# Patient Record
Sex: Female | Born: 1980 | Race: White | Hispanic: No | Marital: Single | Smoking: Never smoker
Health system: Southern US, Community
[De-identification: ages and names within clinical notes are randomized; demographics above are authoritative.]

---

## 2007-05-20 ENCOUNTER — Encounter: Admission: RE | Admit: 2007-05-20 | Discharge: 2007-05-20 | Payer: Self-pay | Admitting: *Deleted

## 2009-06-14 ENCOUNTER — Ambulatory Visit (HOSPITAL_COMMUNITY): Admission: RE | Admit: 2009-06-14 | Discharge: 2009-06-14 | Payer: Self-pay | Admitting: Obstetrics and Gynecology

## 2010-03-21 ENCOUNTER — Encounter
Admission: RE | Admit: 2010-03-21 | Discharge: 2010-03-21 | Payer: Self-pay | Source: Home / Self Care | Attending: Obstetrics and Gynecology | Admitting: Obstetrics and Gynecology

## 2010-03-21 ENCOUNTER — Inpatient Hospital Stay (HOSPITAL_COMMUNITY)
Admission: EM | Admit: 2010-03-21 | Discharge: 2010-03-24 | Payer: Self-pay | Source: Home / Self Care | Attending: Cardiothoracic Surgery | Admitting: Cardiothoracic Surgery

## 2010-03-31 LAB — POCT I-STAT, CHEM 8
BUN: 10 mg/dL (ref 6–23)
Calcium, Ion: 1.07 mmol/L — ABNORMAL LOW (ref 1.12–1.32)
Chloride: 110 mEq/L (ref 96–112)
Creatinine, Ser: 0.9 mg/dL (ref 0.4–1.2)
Glucose, Bld: 96 mg/dL (ref 70–99)
HCT: 41 % (ref 36.0–46.0)
Hemoglobin: 13.9 g/dL (ref 12.0–15.0)
Potassium: 4 mEq/L (ref 3.5–5.1)
Sodium: 139 mEq/L (ref 135–145)
TCO2: 22 mmol/L (ref 0–100)

## 2010-04-04 NOTE — H&P (Signed)
  [name], [name]              ACCOUNT NO.:  [id number]  MEDICAL RECORD NO.:  [id number]          PATIENT TYPE:  INP  LOCATION:  2037                         FACILITY:  [facility]  PHYSICIAN:  Sheliah Plane, MD    DATE OF BIRTH:  January 18, 1981  DATE OF ADMISSION:  03/21/2010 DATE OF DISCHARGE:                             HISTORY & PHYSICAL   CHIEF COMPLAINT:  Right pleuritic chest pain.  HISTORY OF PRESENT ILLNESS:  The patient is a 30 year old female with no previous history of known spontaneous pneumothorax but with several year history of episodic right chest pain usually associated with her menstrual period.  She had never had a chest x-ray done with these episodes in the past, however, today she had an episode of increasing right chest pain and increasing shortness of breath.  At approximately 1 o'clock today, a chest x-ray was done and showed a 40% right pneumothorax.  She was instructed to come to the emergency room and I was called at about 9:15 that she was here with this pneumothorax but in no acute distress but she does note shortness of breath, especially when lying down.  She is a nonsmoker, has no history of asthma.  No family history of pneumothoraces.  She noted similar symptoms in the past, though not nearly as severe.  She is a nonsmoker, nondrinker.  REVIEW OF SYSTEMS:  Negative with exception that she started her menstrual period today.  CURRENT MEDICATIONS:  None.  PHYSICAL EXAMINATION:  VITAL SIGNS:  Blood pressure is 113/75, temp is 98.9, pulse 96, respiratory rate 16, O2 sats 100% on room air. GENERAL:  She is awake, alert, neurologically intact. EYES:  She has no icterus, no carotid bruits. LUNGS:  Have marked decreased breath sounds over the right, clear over the left.  There is no active wheezing. ABDOMEN:  Benign without palpable masses or tenderness.  She has no calf tenderness.  Chest x-ray is reviewed and appears to have a 40% right  pneumothorax without shift.  With the patient's symptoms and the 40% pneumo, discussed with her placement of a right chest tube.  She is agreeable with this and signed informed consent and confirmation of her identity inside is completed with the nurses present.  PROCEDURE NOTE:  With IV Versed and IV morphine, sedation with cardiac monitoring and O2 sat monitoring in place.  The patient's right lateral chest prepped with Betadine and infiltrated with ChloraPrep and 1% lidocaine was infiltrated locally.  A small incision was made, forceps was introduced slightly spreading the muscle fibers, and a 28 chest tube was introduced into the right chest with return of air.  Chest tubes attached to a Pleur-Evac tube is secured in place.  Chest x-ray is pending.     Sheliah Plane, MD     EG/MEDQ  D:  03/21/2010  T:  03/22/2010  Job:  [id number]  Electronically Signed by Sheliah Plane MD on 04/04/2010 01:52:10 PM

## 2010-04-07 ENCOUNTER — Encounter
Admission: RE | Admit: 2010-04-07 | Discharge: 2010-04-07 | Payer: Self-pay | Source: Home / Self Care | Attending: Cardiothoracic Surgery | Admitting: Cardiothoracic Surgery

## 2010-04-07 ENCOUNTER — Ambulatory Visit
Admission: RE | Admit: 2010-04-07 | Discharge: 2010-04-07 | Payer: Self-pay | Source: Home / Self Care | Attending: Cardiothoracic Surgery | Admitting: Cardiothoracic Surgery

## 2010-04-08 NOTE — Assessment & Plan Note (Unsigned)
OFFICE VISIT  [name], [name] DOB:  November 26, 1980                                        April 07, 2010 CHART #:  [id number]  REASON FOR OFFICE VISIT:  Routine followup status post right spontaneous pneumothorax.  HISTORY OF PRESENT ILLNESS:  This is a 30 year old Caucasian female with no previous history of a spontaneous pneumothorax who had experienced a right pleuritic chest pain that was associated with her menstrual cycle. She then developed increasing right chest pain and shortness of breath. She presented to [facility] on March 21, 2010.  A chest x-ray that was done showed a 40% right pneumothorax.  Dr. Tyrone Sage then placed a right chest tube with reexpansion of the right lung.  There was no air leak from the chest tube.  It was able to be removed.  A followup chest x-ray revealed no pneumothorax and she was discharged on March 24, 2010.  She has no complaints today with the exception of some tenderness under her right breast.  She denies any shortness of breath or chest pain.  PHYSICAL EXAMINATION:  General:  This is a pleasant 30 year old Caucasian female, who is in no acute distress, who is alert, oriented, and cooperative.  Vital Signs:  Latest vital signs are as follows.  BP 107/68, heart rate 92, respiration rate 20, and O2 sat 100% on room air. Cardiovascular:  Regular rate and rhythm.  Pulmonary:  Clear to auscultation bilaterally.  No rales, wheezes, or rhonchi.  Chest tube site is clean, dry, and well healed.  DIAGNOSTIC TESTS:  Chest x-ray done today shows no pneumothorax.  IMPRESSION AND PLAN:  The patient is surgically stable status post chest tube insertion for right spontaneous pneumothorax.  She is instructed to follow up with our office p.r.n.  The patient states she had already spoke with Dr. Tyrone Sage upon her admission and is of the understanding that this may happen again with her menstrual cycle.  Regarding the patient's  pain under her right breast, this is likely secondary to previous chest tube insertion site.  The patient states that it is slowly getting better and hopefully the next couple of weeks, this will resolve.  The patient is going to be seen on a p.r.n. basis.  Doree Fudge, PA  DZ/MEDQ  D:  04/07/2010  T:  04/08/2010  Job:  [id number]

## 2010-04-22 NOTE — Discharge Summary (Signed)
[name], [name]              ACCOUNT NO.:  [id number]  MEDICAL RECORD NO.:  [id number]          PATIENT TYPE:  INP  LOCATION:  2037                         FACILITY:  [facility]  PHYSICIAN:  Sheliah Plane, MD    DATE OF BIRTH:  05/15/1980  DATE OF ADMISSION:  03/21/2010 DATE OF DISCHARGE:  03/24/2010                              DISCHARGE SUMMARY   PRIMARY ADMITTING DIAGNOSIS:  Chest pain.  ADDITIONAL/DISCHARGE DIAGNOSES: 1. Spontaneous right pneumothorax. 2. Questionable left lower lobe nodule measuring 3 mm.  PROCEDURES PERFORMED:  Placement of a right chest tube.  HISTORY:  The patient is a 30 year old female who has had several-year history of episodic right-sided chest discomfort, which is usually associated with her menstrual period.  She has never had a chest x-ray done when these episodes have occurred in the past; however, on the date of admission, the episode of chest discomfort was worse than was typical for her and was associated with some shortness of breath.  She ultimately had a chest x-ray performed at the request of her OB/GYN's office and this showed a 40% right pneumothorax.  She was subsequently instructed to come to the Emergency Department at [facility] for further evaluation.  The chest x-ray was reviewed there and there was no evidence of shift, but a 40% pneumothorax was present.  Thoracic Surgery consult was obtained and the patient was seen by Dr. Sheliah Plane.  She remains symptomatic with increased shortness of breath and he felt that at this point, a chest tube should be placed and the patient should be admitted for further evaluation and monitoring.  HOSPITAL COURSE:  Dr. Tyrone Sage did place a right-sided chest tube at the bedside while in the emergency department.  The patient was then transferred to unit 2000 for further monitoring.  Her chest tube was placed on suction and the pneumothorax resolved by chest x-ray.  The tube was  subsequently weaned to water seal and ultimately was discontinued on March 23, 2010.  Followup chest x-ray on March 24, 2010 shows no residual pneumothorax.  During her hospitalization, the patient also had a CT of the chest to evaluate for any bullous disease.  This CT showed no evidence of blebs; however, there was a small nodule in the left lung base measuring 3 mm.  Dr. Tyrone Sage reviewed the films and spoke with the patient about this finding, and we will plan to follow this up as an outpatient.  She remained stable during her hospital stay.  She was seen and evaluated on the morning of March 24, 2010, and was felt to be ready for discharge home at that time.  DISCHARGE MEDICATIONS: 1. Vicodin 5/500 one q.4-6 h. p.r.n. for pain. 2. Advil 200 mg daily for headaches.  DISCHARGE INSTRUCTIONS:  She is asked to refrain from driving, heavy lifting, or strenuous activity.  She may continue ambulating daily and using her incentive spirometer.  She may shower daily and clean her incisions with soap and water.  She will continue her same preoperative diet.  DISCHARGE FOLLOWUP:  She will see Dr. Tyrone Sage in the office on April 10, 2010,  with  a chest x-ray from [facility].  If she experiences any problems or have questions in the interim, she is asked to contact our office immediately.     [name], P.A.   ______________________________ Sheliah Plane, MD    GC/MEDQ  D:  03/24/2010  T:  03/25/2010  Job:  [id number]  cc:   T[facility] Office  Electronically Signed by [name] P.A. on 04/07/2010 03:49:54 PM Electronically Signed by Sheliah Plane MD on 04/22/2010 03:10:48 PM

## 2010-05-15 ENCOUNTER — Other Ambulatory Visit: Payer: Self-pay | Admitting: Cardiothoracic Surgery

## 2010-05-15 ENCOUNTER — Ambulatory Visit (INDEPENDENT_AMBULATORY_CARE_PROVIDER_SITE_OTHER): Payer: BC Managed Care – PPO | Admitting: Cardiothoracic Surgery

## 2010-05-15 ENCOUNTER — Ambulatory Visit
Admission: RE | Admit: 2010-05-15 | Discharge: 2010-05-15 | Disposition: A | Discharge status: Home / Self Care | Payer: BC Managed Care – PPO | Source: Ambulatory Visit | Attending: Cardiothoracic Surgery | Admitting: Cardiothoracic Surgery

## 2010-05-15 DIAGNOSIS — J930 Spontaneous tension pneumothorax: Secondary | ICD-10-CM

## 2010-05-16 NOTE — Assessment & Plan Note (Signed)
OFFICE VISIT  Linda, [name] DOB:  September 02, 1980                                        May 15, 2010 CHART #:  [id number]  The patient was originally seen in early first week in January of this year when she developed a spontaneous right pneumothorax.  A chest tube was placed with resolution.  She has had no obvious recurrence.  At that time, a CT scan did show a 3-mm left lung nodule.  The recommendation at that time was no further CT followup if the patient was low risk for lung cancer, which she is a nonsmoker.  She has had some paresthesias over the right chest probably associated with the chest tube site.  She has had no hemoptysis.  Notes she has mild shortness of breath when she walks quickly through our office.  The chief reason to come to the office today was that she wanted to fly to [place] to see her grandfather who has terminal illness and was concerned about recurrence of pneumothorax in airplane.  We did obtain a chest x-ray today that shows no evidence of pneumothorax.  On exam today, her blood pressure 108/70, pulse is 74, respiratory rate ____18______, O2 sats 99 on room air.  Lungs are clear bilaterally.  The chest tube site is healed.  Overall, she appears stable to fly.  She is aware of the symptoms of recurrent pneumothorax; and because of the issue of the small nodule in the left lung that is of very low probability if anything significant, I have discussed this with her and recommended that we not get a followup CT scan because of the radiation dose in low yield and low risk for lung malignancy, but I will see her back in 6 months with a followup chest x-ray.  Sheliah Plane, MD Electronically Signed  EG/MEDQ  D:  05/15/2010  T:  05/15/2010  Job:  [id number]

## 2010-07-18 ENCOUNTER — Other Ambulatory Visit: Payer: Self-pay | Admitting: Thoracic Surgery

## 2010-07-21 ENCOUNTER — Ambulatory Visit
Admission: RE | Admit: 2010-07-21 | Discharge: 2010-07-21 | Disposition: A | Discharge status: Home / Self Care | Payer: BC Managed Care – PPO | Source: Ambulatory Visit | Attending: Thoracic Surgery | Admitting: Thoracic Surgery

## 2010-07-21 ENCOUNTER — Encounter (INDEPENDENT_AMBULATORY_CARE_PROVIDER_SITE_OTHER): Payer: BC Managed Care – PPO

## 2010-07-21 ENCOUNTER — Inpatient Hospital Stay (HOSPITAL_COMMUNITY)
Admission: AD | Admit: 2010-07-21 | Discharge: 2010-07-25 | DRG: 075 | Disposition: A | Discharge status: Home / Self Care | Payer: BC Managed Care – PPO | Source: Ambulatory Visit | Attending: Cardiothoracic Surgery | Admitting: Cardiothoracic Surgery

## 2010-07-21 DIAGNOSIS — N808 Other endometriosis: Secondary | ICD-10-CM | POA: Diagnosis present

## 2010-07-21 DIAGNOSIS — J9383 Other pneumothorax: Principal | ICD-10-CM | POA: Diagnosis present

## 2010-07-21 DIAGNOSIS — J930 Spontaneous tension pneumothorax: Secondary | ICD-10-CM

## 2010-07-21 DIAGNOSIS — Z79899 Other long term (current) drug therapy: Secondary | ICD-10-CM

## 2010-07-21 LAB — PROTIME-INR
INR: 1 (ref 0.00–1.49)
Prothrombin Time: 13.4 seconds (ref 11.6–15.2)

## 2010-07-21 LAB — URINE MICROSCOPIC-ADD ON

## 2010-07-21 LAB — COMPREHENSIVE METABOLIC PANEL
ALT: 14 U/L (ref 0–35)
AST: 17 U/L (ref 0–37)
Albumin: 3.8 g/dL (ref 3.5–5.2)
Alkaline Phosphatase: 35 U/L — ABNORMAL LOW (ref 39–117)
BUN: 10 mg/dL (ref 6–23)
CO2: 24 mEq/L (ref 19–32)
Calcium: 9.4 mg/dL (ref 8.4–10.5)
Chloride: 103 mEq/L (ref 96–112)
Creatinine, Ser: 0.85 mg/dL (ref 0.4–1.2)
GFR calc Af Amer: >60 mL/min (ref >60)
GFR calc non Af Amer: >60 mL/min (ref >60)
Glucose, Bld: 150 mg/dL — ABNORMAL HIGH (ref 70–99)
Potassium: 3.5 mEq/L (ref 3.5–5.1)
Sodium: 138 mEq/L (ref 135–145)
Total Bilirubin: 0.2 mg/dL — ABNORMAL LOW (ref 0.3–1.2)
Total Protein: 7.2 g/dL (ref 6.0–8.3)

## 2010-07-21 LAB — URINALYSIS, ROUTINE W REFLEX MICROSCOPIC
Glucose, UA: NEGATIVE mg/dL
Ketones, ur: 15 mg/dL — AB
Leukocytes, UA: NEGATIVE
Nitrite: NEGATIVE
Protein, ur: NEGATIVE mg/dL
Specific Gravity, Urine: 1.025 (ref 1.005–1.030)
Urobilinogen, UA: 1 mg/dL (ref 0.0–1.0)
pH: 6 (ref 5.0–8.0)

## 2010-07-21 LAB — CBC
HCT: 36.2 % (ref 36.0–46.0)
Hemoglobin: 11.4 g/dL — ABNORMAL LOW (ref 12.0–15.0)
MCH: 27 pg (ref 26.0–34.0)
MCHC: 31.5 g/dL (ref 30.0–36.0)
MCV: 85.8 fL (ref 78.0–100.0)
Platelets: 185 10*3/uL (ref 150–400)
RBC: 4.22 MIL/uL (ref 3.87–5.11)
RDW: 15 % (ref 11.5–15.5)
WBC: 6.8 10*3/uL (ref 4.0–10.5)

## 2010-07-21 LAB — TYPE AND SCREEN
ABO/RH(D): A POS
Antibody Screen: NEGATIVE
Unit division: 0

## 2010-07-21 LAB — APTT: aPTT: 31 seconds (ref 24–37)

## 2010-07-21 LAB — ABO/RH: ABO/RH(D): A POS

## 2010-07-21 LAB — MRSA PCR SCREENING: MRSA by PCR: NEGATIVE

## 2010-07-22 ENCOUNTER — Inpatient Hospital Stay (HOSPITAL_COMMUNITY): Payer: BC Managed Care – PPO

## 2010-07-22 ENCOUNTER — Other Ambulatory Visit: Payer: Self-pay | Admitting: Cardiothoracic Surgery

## 2010-07-22 DIAGNOSIS — J930 Spontaneous tension pneumothorax: Secondary | ICD-10-CM

## 2010-07-22 DIAGNOSIS — D213 Benign neoplasm of connective and other soft tissue of thorax: Secondary | ICD-10-CM

## 2010-07-22 LAB — GLUCOSE, CAPILLARY: Glucose-Capillary: 89 mg/dL (ref 70–99)

## 2010-07-23 ENCOUNTER — Inpatient Hospital Stay (HOSPITAL_COMMUNITY): Payer: BC Managed Care – PPO

## 2010-07-23 LAB — CBC
HCT: 37.8 % (ref 36.0–46.0)
Hemoglobin: 12.2 g/dL (ref 12.0–15.0)
MCH: 27.9 pg (ref 26.0–34.0)
MCHC: 32.3 g/dL (ref 30.0–36.0)
MCV: 86.3 fL (ref 78.0–100.0)
Platelets: 186 10*3/uL (ref 150–400)
RBC: 4.38 MIL/uL (ref 3.87–5.11)
RDW: 14.8 % (ref 11.5–15.5)
WBC: 10.9 10*3/uL — ABNORMAL HIGH (ref 4.0–10.5)

## 2010-07-23 LAB — BASIC METABOLIC PANEL
BUN: 4 mg/dL — ABNORMAL LOW (ref 6–23)
CO2: 24 mEq/L (ref 19–32)
Calcium: 8.6 mg/dL (ref 8.4–10.5)
Chloride: 105 mEq/L (ref 96–112)
Creatinine, Ser: 0.67 mg/dL (ref 0.4–1.2)
GFR calc Af Amer: >60 mL/min (ref >60)
GFR calc non Af Amer: >60 mL/min (ref >60)
Glucose, Bld: 141 mg/dL — ABNORMAL HIGH (ref 70–99)
Potassium: 3.5 mEq/L (ref 3.5–5.1)
Sodium: 138 mEq/L (ref 135–145)

## 2010-07-23 LAB — POCT I-STAT 3, ART BLOOD GAS (G3+)
Acid-base deficit: 1 mmol/L (ref 0.0–2.0)
Bicarbonate: 23.9 mEq/L (ref 20.0–24.0)
O2 Saturation: 94 %
TCO2: 25 mmol/L (ref 0–100)
pCO2 arterial: 39 mmHg (ref 35.0–45.0)
pH, Arterial: 7.395 (ref 7.350–7.400)
pO2, Arterial: 71 mmHg — ABNORMAL LOW (ref 80.0–100.0)

## 2010-07-24 ENCOUNTER — Inpatient Hospital Stay (HOSPITAL_COMMUNITY): Payer: BC Managed Care – PPO

## 2010-07-25 ENCOUNTER — Inpatient Hospital Stay (HOSPITAL_COMMUNITY): Payer: BC Managed Care – PPO

## 2010-07-25 NOTE — H&P (Signed)
[name], [name]              ACCOUNT NO.:  [id number]  MEDICAL RECORD NO.:  [id number]           PATIENT TYPE:  I  LOCATION:  2007                         FACILITY:  [facility]  PHYSICIAN:  Sheliah Plane, MD    DATE OF BIRTH:  [birth year]  DATE OF ADMISSION:  07/21/2010 DATE OF DISCHARGE:                             HISTORY & PHYSICAL   CHIEF COMPLAINT:  Shortness of breath.  HISTORY OF PRESENT ILLNESS:  The patient is a 30 year old female who was admitted to [facility] in January 2012 with a spontaneous right pneumothorax.  She apparently had begun experiencing right-sided chest discomfort around the time of her menstrual cycle and ultimately presented with increasing shortness of breath and 40% right pneumothorax.  A chest tube was placed and she was admitted for further management.  At the time of her discharge, her pneumothorax had completely resolved.  CT scan was performed during that admission which showed no evidence of bullous disease; however, there was a small nodule incidentally noted in the left lung base.  She returned for follow up in our office as recently as May 15, 2010, and at that time since she had remained asymptomatic and had no further issues, Dr. Tyrone Sage recommended a 34-month followup with a chest x-ray to reevaluate the left lung nodule.  Over the course of the last 4 days, the patient has noted increasing dyspnea with recurrence of right-sided chest discomfort, with pain in her back and her right shoulder which was similar in nature to the pain she experienced with her initial pneumothorax.  She denies fevers, chills, cough, hemoptysis, or upper respiratory symptoms.  She called our office today and was seen with a chest x-ray which showed a recurrent 40% right pneumothorax.  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  The patient had just experienced a dosage change, and at the time she developed symptoms on Thursday, was at the point where her normal menstrual cycle would have started.  PAST MEDICAL HISTORY:  Negative for cardiopulmonary disease, diabetes, cancer, strokes, or other chronic medical problems.  CURRENT MEDICATIONS: 1. Birth control pills. 2. Claritin 10 mg daily.  ALLERGIES:  No known drug allergies.  PAST SURGICAL HISTORY:  None.  FAMILY HISTORY:  Noncontributory to present illness.  SOCIAL HISTORY:  She is single and resides in [place], [place]. 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  She does not smoke.  REVIEW OF SYSTEMS:  See history of present illness for pertinent positives and negatives.  She denies any weight loss, visual changes, syncope, dizziness, heart palpitations, abdominal pain, nausea, vomiting, diarrhea, constipation, hematochezia, melena, hematemesis, hematuria, dysuria or nocturia, musculoskeletal pain or swelling, intolerance to heat or cold.  PHYSICAL EXAMINATION:  VITAL SIGNS:  Blood pressure is 135/85, pulse is 110 and regular, respirations 20, O2 sat 99% on room air. HEENT:  Normocephalic, atraumatic.  Pupils equal, round, and reactive to light and accommodation.  Extraocular movements intact.  Exam of the external ears and nose reveal no abnormalities.  Oropharynx is clear with moist mucous membranes. NECK:  Supple.  No adenopathy. HEART:  Regular rate and rhythm without murmurs, rubs, or gallops. LUNGS:  Breath sounds are slightly diminished in the right base, otherwise clear. ABDOMEN:  Soft, nontender, nondistended with active bowel sounds. EXTREMITIES:  No clubbing, cyanosis, or edema.  Chest x-ray shows a 40% right pneumothorax.  ASSESSMENT AND PLAN:  [name] has had a recurrence of right spontaneous pneumothorax.  At this time since she is not in respiratory distress, she would like to avoid a chest tube  placement.  However, Dr. Tyrone Sage feels that she should be admitted for observation and for definitive management of her recurrent pneumothorax.  We have discussed all the risks, benefits, and alternatives of proceeding with a right VATS with bleb resection and pleurodesis and the patient agrees to proceed.  She will be scheduled for surgery on Jul 22, 2010, by Dr. Tyrone Sage.     Coral Ceo, P.A.   ______________________________ Sheliah Plane, MD    GC/MEDQ  D:  07/21/2010  T:  07/22/2010  Job:  [id number]  Electronically Signed by Coral Ceo P.A. on 07/22/2010 01:07:19 PM Electronically Signed by Sheliah Plane MD on 07/25/2010 09:42:53 AM

## 2010-07-25 NOTE — Op Note (Signed)
NAMELORMA, HEATER              ACCOUNT NO.:  [id number]  MEDICAL RECORD NO.:  [id number]           PATIENT TYPE:  I  LOCATION:  2313                         FACILITY:  [facility]  PHYSICIAN:  Sheliah Plane, MD    DATE OF BIRTH:  24-Jun-1980  DATE OF PROCEDURE:  07/22/2010 DATE OF DISCHARGE:                              OPERATIVE REPORT   PREOPERATIVE DIAGNOSIS:  Recurrent right pneumothorax, question catamenial pneumothorax.  POSTOPERATIVE DIAGNOSIS:  Recurrent right pneumothorax, question catamenial pneumothorax.  SURGICAL PROCEDURE:  Right video-assisted thoracoscopy with mini thoracotomy, wedge resection of lower edge of right upper lobe, stapling of apex of the lung, biopsy of diaphragm, and plication of diaphragm.  SURGEON:  Sheliah Plane, MD.  FIRST ASSISTANT:  Rowe Clack, PA-C.  BRIEF HISTORY:  The patient is a 30 year old female who in January of this year presented with a right pneumothorax.  At that time, a right chest tube was placed with resolution of the pneumothorax.  She had given a history of several previous episodes of right chest pain associated with menses.  The diagnosis of catamenial pneumothorax was considered with the resolution of pneumothorax with chest tube and no further treatment was carried out, although, she did have manipulation of hormone treatments.  She returned with recurrent right chest pain, and on chest x-ray was showed to have 40%-50% pneumothorax. With the second recurrence, right VATS was recommended to her.  She agreed and signed informed consent.  DESCRIPTION OF PROCEDURE:  With Swan-Ganz and arterial line monitors in place and after completion of formal time-out, the patient was turned in lateral decubitus position.  The right side of the chest was prepped with Betadine and draped in usual sterile manner.  In the fourth intercostal space, midaxillary line, a trocar site was created after deflating the right lung.  Through  this, a 30-degree video thoracoscope was passed and the chest cavity was examined.  There were a few fibrinous adhesions at the apex.  There were no large apical blebs appreciated.  There was one area along the lower edge of the right upper lobe fissure that was suspicious, in addition, on the membranous diaphragm along the dome where several areas of darkened areas with appeared to be hemosiderin deposits, raising issue of questionable relationship to endometriosis.  The few adhesions at the apex, additional anterior and posterior port slightly higher were then placed. The adhesions to the top lung were taken down.  The apex of the lung was examined, although, there was no large blebs appreciated.  We stapled across the apex of the lung with 45 no-knife stapler.  The small suspicious area along the edge of the right upper lobe with a small wedge resection was performed and this segment of lung was submitted to the pathologist and labeled, right upper lobe.  Attention was then turned to close examination of the diaphragm.  Through the scope, themost prominent of these areas were biopsied with a biopsy forceps. Frozen section was indeterminate, appeared to be proliferative tissue. The pathologist raise Dr. Luisa Hart the question of malignancy.  However, grossly this did not appear to be the case.  With  biopsy, the areas of discoloration were extremely thin and even a small biopsy forces resulted in a defect in the diaphragm.  It was decided that these were most likely channels of and potential source of pneumothorax, low on the chest and the midaxillary line, approximately the 8 or 9 space as directed with the scope.  A small incision was made between the ribs with the scope and through the small incision we could precisely deal with the diaphragm.  Additional biopsy of the suspicious areas on the diaphragm were biopsied, and again frozen section was reported back by Dr. Luisa Hart is consistent  with endometriosis.  The areas of discoloration and thinness along the diaphragm were somewhat in linear fashion along the dome.  It was decided to reinforce these areas by plicating the thinned areas of diaphragm.  Interrupted pledgeted 4-0 Prolene sutures were placed in linear fashion, plicating along approximately 4 cm area along the dome of the diaphragm.  The remainder of the diaphragm appeared completely normal without any areas of tenderness or discoloration.  The muscular portion of the diaphragm appeared totally normal.  With this completed, the lung was re-expanded and the saline was placed into the chest cavity.  There was no evidence of air leak.  The small lower incision was closed after drilling two small holes in each rib associated with the incision and through this intercostal suture was placed.  The deep layers were closed with interrupted 0 Vicryl and 3-0 subcuticular stitch.  The anterior and posterior port sites were closed with interrupted 2-0 Vicryl and 3-0 subcuticular stitch and mid axillary line port site was used as the chest tube site and a single 28 chest tube was placed toward the apex of the lung, and secured in place.  The right lung was ventilated.  There was no air leak from the chest tube.  The patient was awakened and extubated in operating room and transferred to the recovery room in good condition.  Sponge and needle count was reported as correct at the completion of procedure.  ESTIMATED BLOOD LOSS:  Approximately 100 mL.     Sheliah Plane, MD     EG/MEDQ  D:  07/23/2010  T:  07/23/2010  Job:  [id number]  Electronically Signed by Sheliah Plane MD on 07/25/2010 09:42:55 AM

## 2010-07-28 NOTE — Discharge Summary (Signed)
[name], [name]              ACCOUNT NO.:  [id number]  MEDICAL RECORD NO.:  [id number]           PATIENT TYPE:  I  LOCATION:  2032                         FACILITY:  [facility]  PHYSICIAN:  Sheliah Plane, MD    DATE OF BIRTH:  [birth year]  DATE OF ADMISSION:  07/21/2010 DATE OF DISCHARGE:  07/25/2010                              DISCHARGE SUMMARY   ADMITTING DIAGNOSES: 1. Recurrent right spontaneous pneumothorax. 2. Likely endometriosis.  DISCHARGE DIAGNOSES: 1. Recurrent right spontaneous pneumothorax. 2. Likely endometriosis.  PROCEDURES:  Right video-assisted thoracoscopic surgery, right mini thoracotomy, wedge resection of right upper lobe, stapling of the apex of the lung, biopsies of the diaphragm, and plication of the diaphragm by Dr. Tyrone Sage on Jul 22, 2010.  PATHOLOGY RESULTS:  Soft tissue biopsy of the diaphragm implant: 1. Negative for malignancy. 2. Again negative for malignancy, benign endometriotic implants.  Lung wedge biopsy of the right upper lobe:  Negative for dysplasia or malignancy, patchy minimal chronic inflammation.  HISTORY OF PRESENT ILLNESS:  This is a 30 year old Caucasian female who experienced her first spontaneous right pneumothorax around the time of her menstrual cycle, March 21, 2010.  Her symptomatology included right- sided chest discomfort and increasing shortness of breath.  Chest x-ray that was done at that time showed a 40% right pneumothorax.  A CT scan of the chest that was done on March 23, 2010 showed right lung pneumothorax, no blood seen, right base atelectasis, and air space consolidation and a small node in the left lung that measured about 3 mm.  A right chest tube was placed, the patient had re-expansion of her lung, and was discharged in stable condition on March 25, 2010.  The patient was then seen on two different subsequent office visits where she was not found to have a pneumothorax.  However, over the course  of approximately 4 days prior to this admission, the patient began experiencing increasing dyspnea with recurrence of right-sided chest discomfort with pain in her back and right shoulder (similar to her previous experience when she had a pneumothorax).  She denied any fever, chills, cough, hemoptysis, or upper respiratory symptoms.  A chest x-ray that was done showed a recurrent right 40% pneumothorax.  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  The patient was admitted to [facility] for surgical treatment of her recurrent right pneumothorax.  BRIEF HOSPITAL COURSE STAY:  The patient underwent the aforementioned right lung surgery on Jul 22, 2010.  As previously stated, one of the biopsies of the implant on the diaphragm was an endometriotic implant. The patient remained afebrile and hemodynamically stable postoperatively.  She did not have an air leak.  Her chest tube was placed to water seal.  She was felt surgically stable for transfer from the Intensive Care Unit to 2000 on Jul 23, 2010.  Her Foley was removed on this day.  Followup chest x-rays revealed a small stable right pneumothorax.  Again, she had no air leak and her chest tube is going to be removed  today on postoperative day #2.  She will have a chest x-ray obtained in the morning and provided this remained stable she will be surgically stable for discharge on Jul 25, 2010.  LATEST LABORATORY STUDIES:  BMET done on Jul 23, 2010, potassium 3.5 has been supplemented, sodium 138, BUN and creatinine 4 and 0.67 respectively.  CBC on this date, H and H 12.2 and 37.8, white count 10,900, and platelet count of 86,000.  Latest chest x-ray done on Jul 24, 2010 showed a stable small right pneumothorax and increased basilar atelectasis.  DISCHARGE INSTRUCTIONS:  Include the following:  DIET:  Regular  diet.  ACTIVITY:  The patient may walk up steps.  She may shower.  She is not to lift more than 10 pounds for 2 weeks.  She is not to drive until after 2 weeks.  She is to continue with the breathing exercise daily. She is to walk daily and increase her frequency and duration as she tolerates.  WOUND CARE:  She is to use soap and water on her wounds.  She is to contact our office if any wound problems arise.  FOLLOWUP APPOINTMENTS:  Include to see Dr. Tyrone Sage on Aug 07, 2010 at 11:00 a.m. and 45 minutes prior to this office appointment a chest x-ray will be obtained.  DISCHARGE MEDICATIONS AT TIME OF DICTATION: 1. Percocet 5/325 one to two tablets p.o. q.4-6 h. p.r.n. pain. 2. Claritin 10 mg p.o. daily. 3. Lo Loestrin birth control pill 1 tablet p.o. daily. 4. Phentermine 15 mg p.o. daily p.r.n.     Doree Fudge, PA   ______________________________ Sheliah Plane, MD    DZ/MEDQ  D:  07/24/2010  T:  07/24/2010  Job:  [id number]  cc:   Arlana Lindau, NP  Electronically Signed by Doree Fudge PA on 07/28/2010 08:12:49 AM Electronically Signed by Sheliah Plane MD on 07/28/2010 04:10:33 PM

## 2010-07-30 ENCOUNTER — Encounter (INDEPENDENT_AMBULATORY_CARE_PROVIDER_SITE_OTHER): Payer: Self-pay

## 2010-07-30 DIAGNOSIS — J930 Spontaneous tension pneumothorax: Secondary | ICD-10-CM

## 2010-08-06 ENCOUNTER — Other Ambulatory Visit: Payer: Self-pay | Admitting: Cardiothoracic Surgery

## 2010-08-06 DIAGNOSIS — J930 Spontaneous tension pneumothorax: Secondary | ICD-10-CM

## 2010-08-07 ENCOUNTER — Ambulatory Visit
Admission: RE | Admit: 2010-08-07 | Discharge: 2010-08-07 | Disposition: A | Discharge status: Home / Self Care | Payer: BC Managed Care – PPO | Source: Ambulatory Visit | Attending: Cardiothoracic Surgery | Admitting: Cardiothoracic Surgery

## 2010-08-07 ENCOUNTER — Ambulatory Visit (INDEPENDENT_AMBULATORY_CARE_PROVIDER_SITE_OTHER): Payer: Self-pay | Admitting: Cardiothoracic Surgery

## 2010-08-07 DIAGNOSIS — J930 Spontaneous tension pneumothorax: Secondary | ICD-10-CM

## 2010-08-08 NOTE — Assessment & Plan Note (Signed)
OFFICE VISIT  AMINATA, BUFFALO DOB:  [birth year]                                        Aug 07, 2010 CHART #:  [id number]  The patient returns to the office today for followup visit.  She had originally presented in January 2012, with a right pleuritic chest pain, right pneumothorax.  Chest tube was placed with resolution in the pneumothorax.  Consideration for this possibly being related to endometriosis because of her previous history of intermittent right chest pain related to menstrual periods over months.  She then returned in April with recurrence of a pneumothorax and VATS of the right chest was recommended.  She did find evidence of endometrial deposits on her diaphragm, this was plicated and some small apical blebs were stapled. She now returns to the office today for followup visit, doing well.  She has not returned to work.  She has some mild right chest discomfort at the incision sites, but this is markedly improved and she is no longer needing any pain medicine.  On examination, her blood pressure 103/62, pulse 84, respiratory rate is 99.  Lungs are clear bilaterally.  Chest tube sites are all healing well.  She has no calf tenderness.  Follow up chest x-ray shows postoperative changes.  There is no evidence of effusion.  Lung fields are clear.  The patient is doing well following surgical treatment of her recurrent spontaneous pneumo with biopsy-proven endometrial deposits on her diaphragm.  We will plan to see her back in 3 months with a followup chest x-ray.  Sheliah Plane, MD Electronically Signed  EG/MEDQ  D:  08/07/2010  T:  08/08/2010  Job:  [id number]  cc:   [facility] J. Sherrie Mustache

## 2010-09-23 NOTE — Assessment & Plan Note (Signed)
OFFICE VISIT  CADYN, RODGER DOB:  1980-09-19                                        Jul 21, 2010 CHART #:  [id number]  Ms. Flowers is well known to the [facility] office.  She is status post previous admission in January 2012 for a spontaneous right pneumothorax. At that time, she underwent placement of a chest tube and her pneumothorax resolved.  She has been seen in our office most as recently as March 1 and at that time she was stable.  She was scheduled to come back in 6 months for followup of a left lung nodule which was found incidentally on CT scan.  However, over the course of the past 4 days, she has developed increasing right-sided chest discomfort with shortness of breath, back pain, and shoulder pain which is similar to the discomfort she experienced when she had her original pneumothorax.  She has been afebrile and has had no other symptoms including cough, wheezing, or upper respiratory symptoms.  Prior to her initial admission, the patient had episodic right chest pain, which was associated with her menstrual period, and after her discharge, she discussed this with her OB/GYN and was placed on an ongoing dose of birth control pills in speculation that this was a catamenial endometriosis related.  The patient reports that her dosage of the birth control pills had recently been adjusted around the time that her symptoms began on Thursday and that would have been the time that her normal menstrual cycle would have occurred.  PHYSICAL EXAM:  VITAL SIGNS:  Blood pressure is 135/85, pulse is 110, respirations 20, O2 sat 99% on room air.  HEART:  Regular rate and rhythm without murmurs, rubs or gallops.  LUNGS:  Clear though breath sounds are slightly diminished on the right.  Chest x-ray shows a 40% right pneumothorax.  ASSESSMENT AND PLAN:  Ms. Collier Salina has recurrent right spontaneous pneumothorax.  I have spoken with Dr. Tyrone Sage and he feels that  since this has recurred in such a short period of time that she will require VATS for definitive therapy.  The patient would like to avoid another chest tube placement at this time and I feel that as long as she is not in respiratory distress this may be observed.  We will admit her to [facility] at this time and we will plan a right VATS with bleb resection and pleurodesis on Jul 22, 2010.  Coral Ceo, P.A.  GC/MEDQ  D:  07/21/2010  T:  07/22/2010  Job:  [id number]

## 2010-11-03 ENCOUNTER — Encounter: Payer: Self-pay | Admitting: *Deleted

## 2010-11-03 DIAGNOSIS — J939 Pneumothorax, unspecified: Secondary | ICD-10-CM | POA: Insufficient documentation

## 2010-11-03 DIAGNOSIS — J9383 Other pneumothorax: Secondary | ICD-10-CM

## 2010-11-03 HISTORY — PX: OTHER SURGICAL HISTORY: SHX169

## 2010-11-05 ENCOUNTER — Other Ambulatory Visit: Payer: Self-pay | Admitting: Cardiothoracic Surgery

## 2010-11-05 DIAGNOSIS — J930 Spontaneous tension pneumothorax: Secondary | ICD-10-CM

## 2010-11-06 ENCOUNTER — Encounter: Payer: Self-pay | Admitting: Cardiothoracic Surgery

## 2010-11-06 ENCOUNTER — Ambulatory Visit (INDEPENDENT_AMBULATORY_CARE_PROVIDER_SITE_OTHER): Payer: Self-pay | Admitting: Cardiothoracic Surgery

## 2010-11-06 ENCOUNTER — Ambulatory Visit
Admission: RE | Admit: 2010-11-06 | Discharge: 2010-11-06 | Disposition: A | Discharge status: Home / Self Care | Payer: BC Managed Care – PPO | Source: Ambulatory Visit | Attending: Cardiothoracic Surgery | Admitting: Cardiothoracic Surgery

## 2010-11-06 VITALS — BP 106/69 | HR 88 | Resp 16 | Ht 62.0 in | Wt 126.0 lb

## 2010-11-06 DIAGNOSIS — J9383 Other pneumothorax: Secondary | ICD-10-CM

## 2010-11-06 DIAGNOSIS — N939 Abnormal uterine and vaginal bleeding, unspecified: Secondary | ICD-10-CM

## 2010-11-06 DIAGNOSIS — N926 Irregular menstruation, unspecified: Secondary | ICD-10-CM

## 2010-11-06 DIAGNOSIS — Z09 Encounter for follow-up examination after completed treatment for conditions other than malignant neoplasm: Secondary | ICD-10-CM

## 2010-11-06 DIAGNOSIS — J930 Spontaneous tension pneumothorax: Secondary | ICD-10-CM

## 2010-11-06 DIAGNOSIS — J939 Pneumothorax, unspecified: Secondary | ICD-10-CM

## 2010-11-06 NOTE — Progress Notes (Signed)
  HPI  Patient returns for routine postoperative follow-up having undergone   SURGICAL PROCEDURE: Right video-assisted thoracoscopy with mini  thoracotomy, wedge resection of lower edge of right upper lobe, stapling  of apex of the lung, biopsy of diaphragm, and plication of diaphragm. 07/22/10  At the time of surgery the patient was found to have catamenial pneumothorax. Since hospital discharge the patient reports no recurrence.    Current Outpatient Prescriptions  Medication Sig Dispense Refill  . loratadine (CLARITIN) 10 MG tablet Take 10 mg by mouth daily.        . norethindrone-ethinyl estradiol (JUNEL FE,GILDESS FE,LOESTRIN FE) 1-20 MG-MCG tablet Take 1 tablet by mouth daily.        . phentermine 15 MG capsule Take 15 mg by mouth every morning. PRN           Physical Exam Patient is very pleasant and in no acute distress. Skin is warm and dry. Color is normal.  HEENT is unremarkable. Normocephalic/atraumatic. PERRL. Sclera are nonicteric. Neck is supple. No masses. No JVD. Lungs are clear. Cardiac exam shows a regular rate and rhythm. The right chest wall incision and chest tube sites are well-healed Abdomen is soft. Extremities are without edema. Gait and ROM are intact. No gross neurologic deficits noted.  Diagnostic tests:  A chest x-ray was performed today without evidence of recurrent pneumothorax  Impression: & Plan  Patient is doing well in followup after her right that's for a catamenial spontaneous right pneumothorax there's been no evidence of recurrent. I've discussed with her the possibility of recurrent pneumonia on the opposite side should she have any recurrent symptoms to seek medical attention. I have not made her a followup appointment to see me to see her at any time at her request.

## 2010-11-13 ENCOUNTER — Ambulatory Visit: Payer: BC Managed Care – PPO | Admitting: Cardiothoracic Surgery

## 2011-08-06 ENCOUNTER — Ambulatory Visit
Admission: RE | Admit: 2011-08-06 | Discharge: 2011-08-06 | Disposition: A | Discharge status: Home / Self Care | Payer: BC Managed Care – PPO | Source: Ambulatory Visit | Attending: Obstetrics and Gynecology | Admitting: Obstetrics and Gynecology

## 2011-08-06 ENCOUNTER — Other Ambulatory Visit: Payer: Self-pay | Admitting: Obstetrics and Gynecology

## 2012-01-07 IMAGING — CR DG CHEST 2V
2 series · 2 of 2 positions shown · non-contrast
Comparison: 04/07/2010 and earlier

CLINICAL DATA: Chest pain - history of pneumothorax

CHEST - 2 VIEW

[w chest pa]
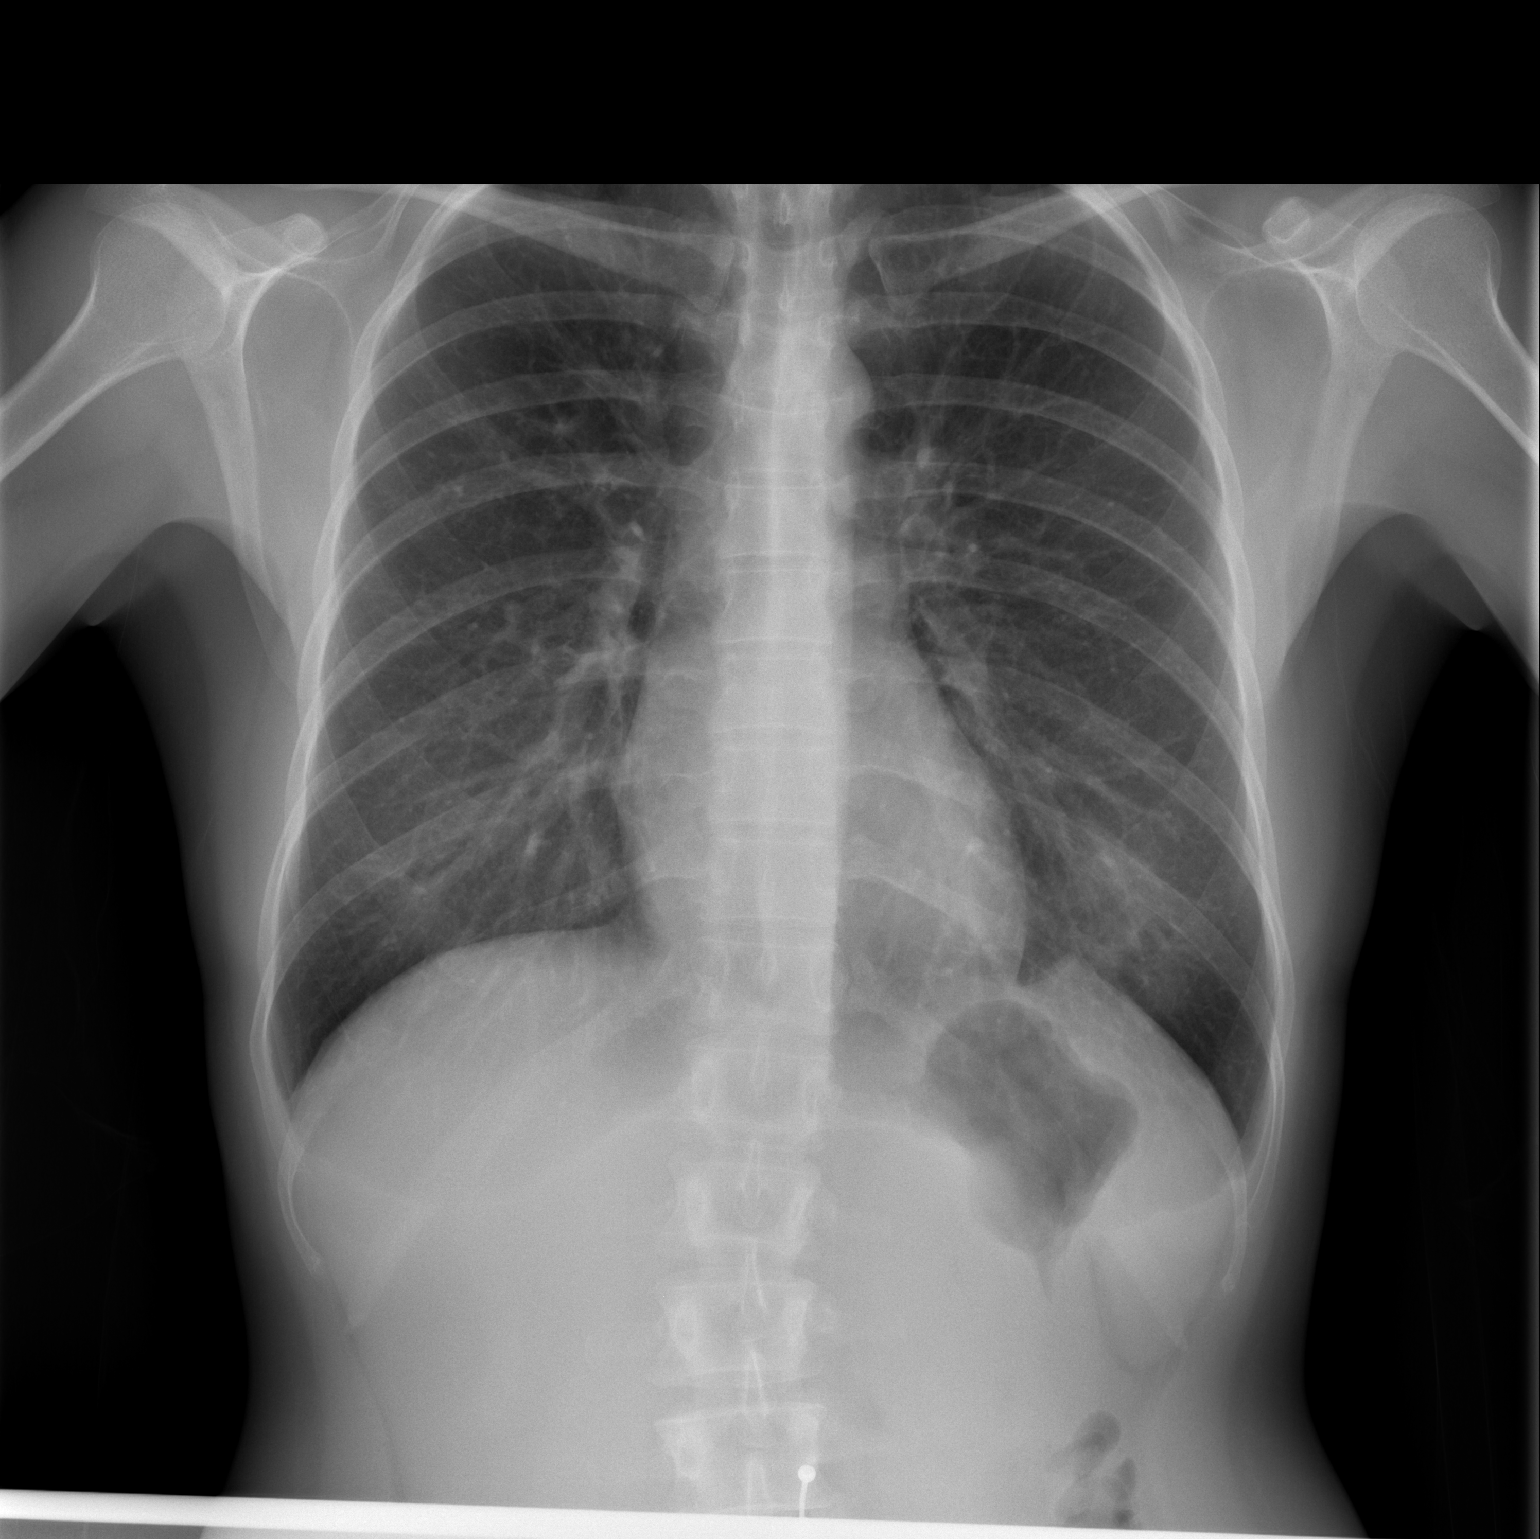

[w chest lat]
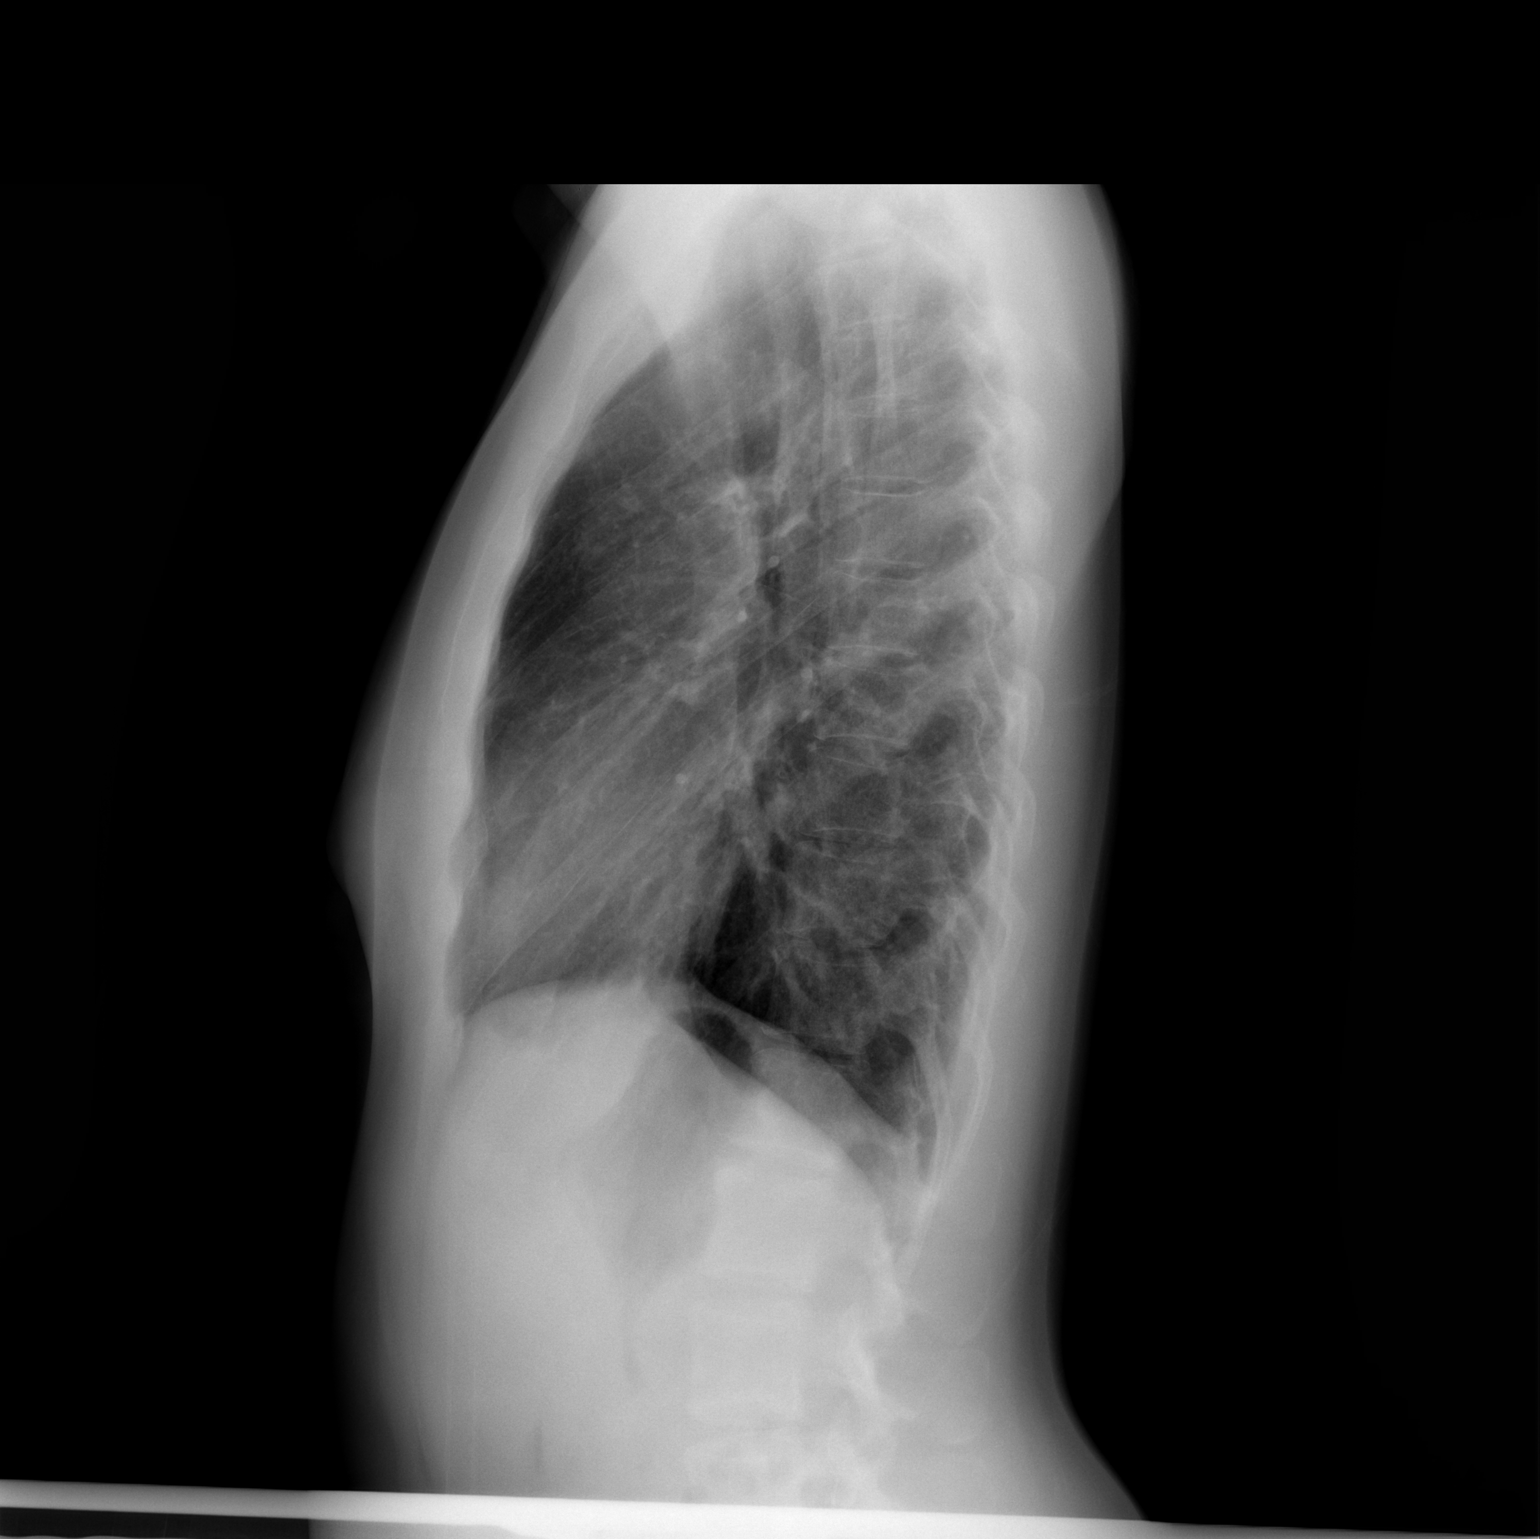

[2 of 2 positions shown; findings below may reference images not displayed]

FINDINGS: The heart and mediastinal contours are normal.  Lungs
essentially clear with stable, prominent left lower lobe or left
breast markings.

No pneumothorax.  No pleural fluid or osseous lesions.
IMPRESSION: No pneumothorax or other acute findings.

## 2012-03-16 IMAGING — CR DG CHEST 1V PORT
1 series · 1 of 1 positions shown · non-contrast
Comparison: 07/22/2010

CLINICAL DATA: VATS.

PORTABLE CHEST - 1 VIEW

[view not recorded]
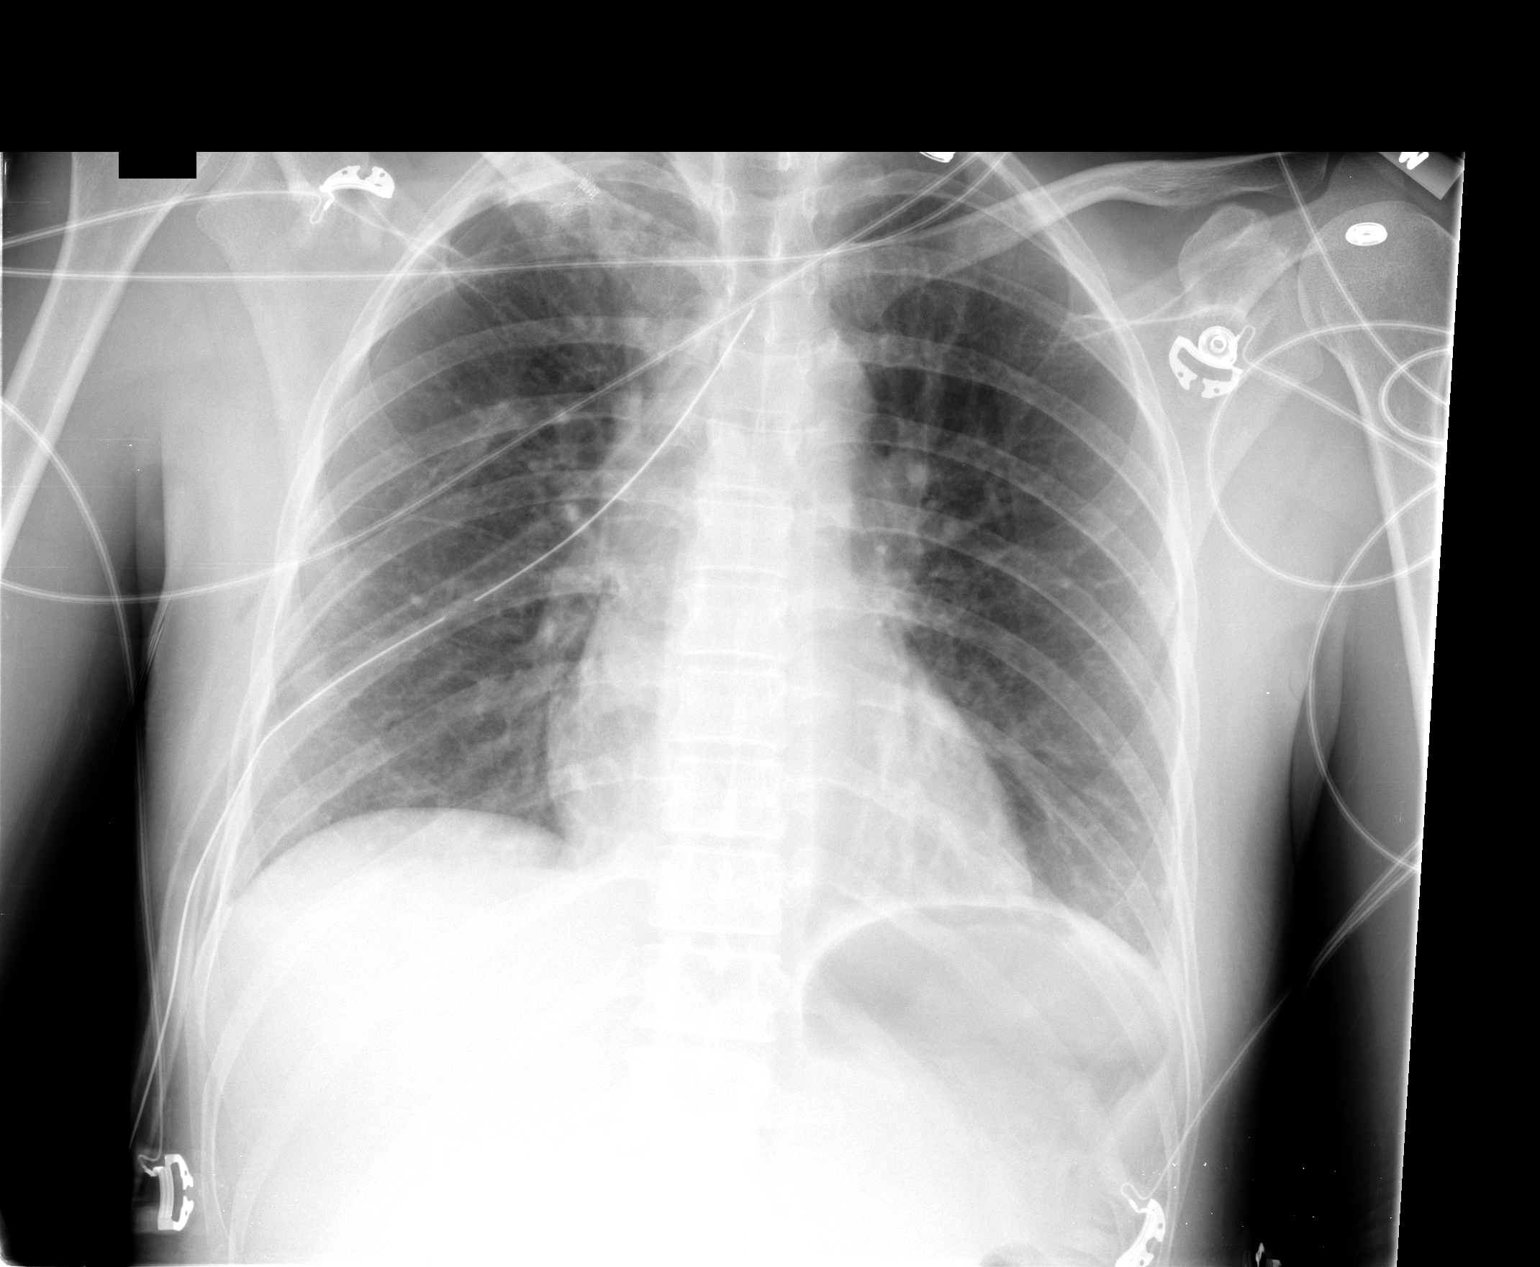

[1 of 1 positions shown; findings below may reference images not displayed]

FINDINGS: Right chest tube remains in place with postoperative
changes in the right apex.  Small upper and lateral pneumothorax.
The is new.  No focal opacities in the lungs.  Heart is normal
size.
IMPRESSION: Small right-sided pneumothorax following VATS.  Right chest tube
remains in place.

## 2012-03-17 IMAGING — CR DG CHEST 1V PORT
1 series · 1 of 1 positions shown · non-contrast
Comparison: Yesterday

CLINICAL DATA: Pneumothorax

PORTABLE CHEST - 1 VIEW

[view not recorded]
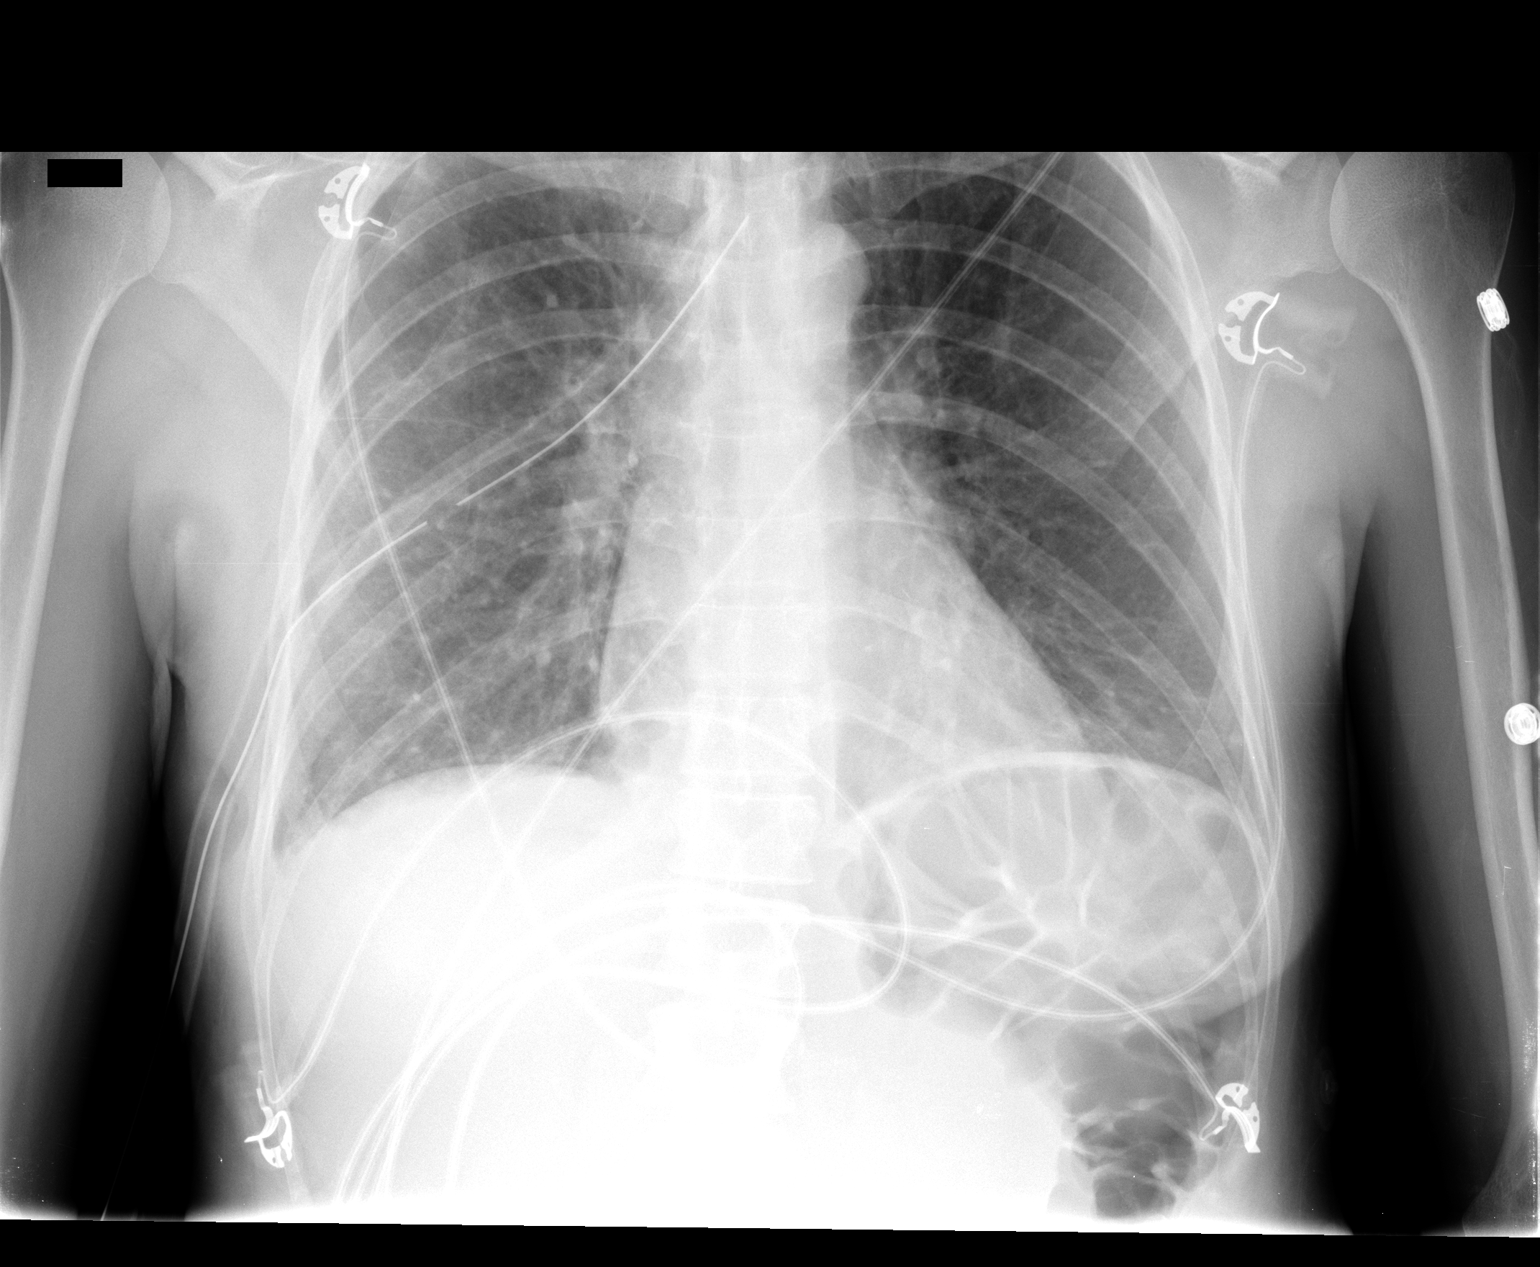

[1 of 1 positions shown; findings below may reference images not displayed]

FINDINGS: Stable small right pneumothorax.  Right chest tube
unchanged.  Mild basilar hypoaeration has increased.  No left
pneumothorax.  Normal heart size.
IMPRESSION: Stable small right pneumothorax. Stable right chest tube.

Increased basilar atelectasis.

## 2012-03-18 IMAGING — CR DG CHEST 2V
2 series · 2 of 2 positions shown · non-contrast
Comparison: 07/24/2010

CLINICAL DATA: Right pneumothorax, followup.  Chest tube removal.

CHEST - 2 VIEW

[w chest pa]
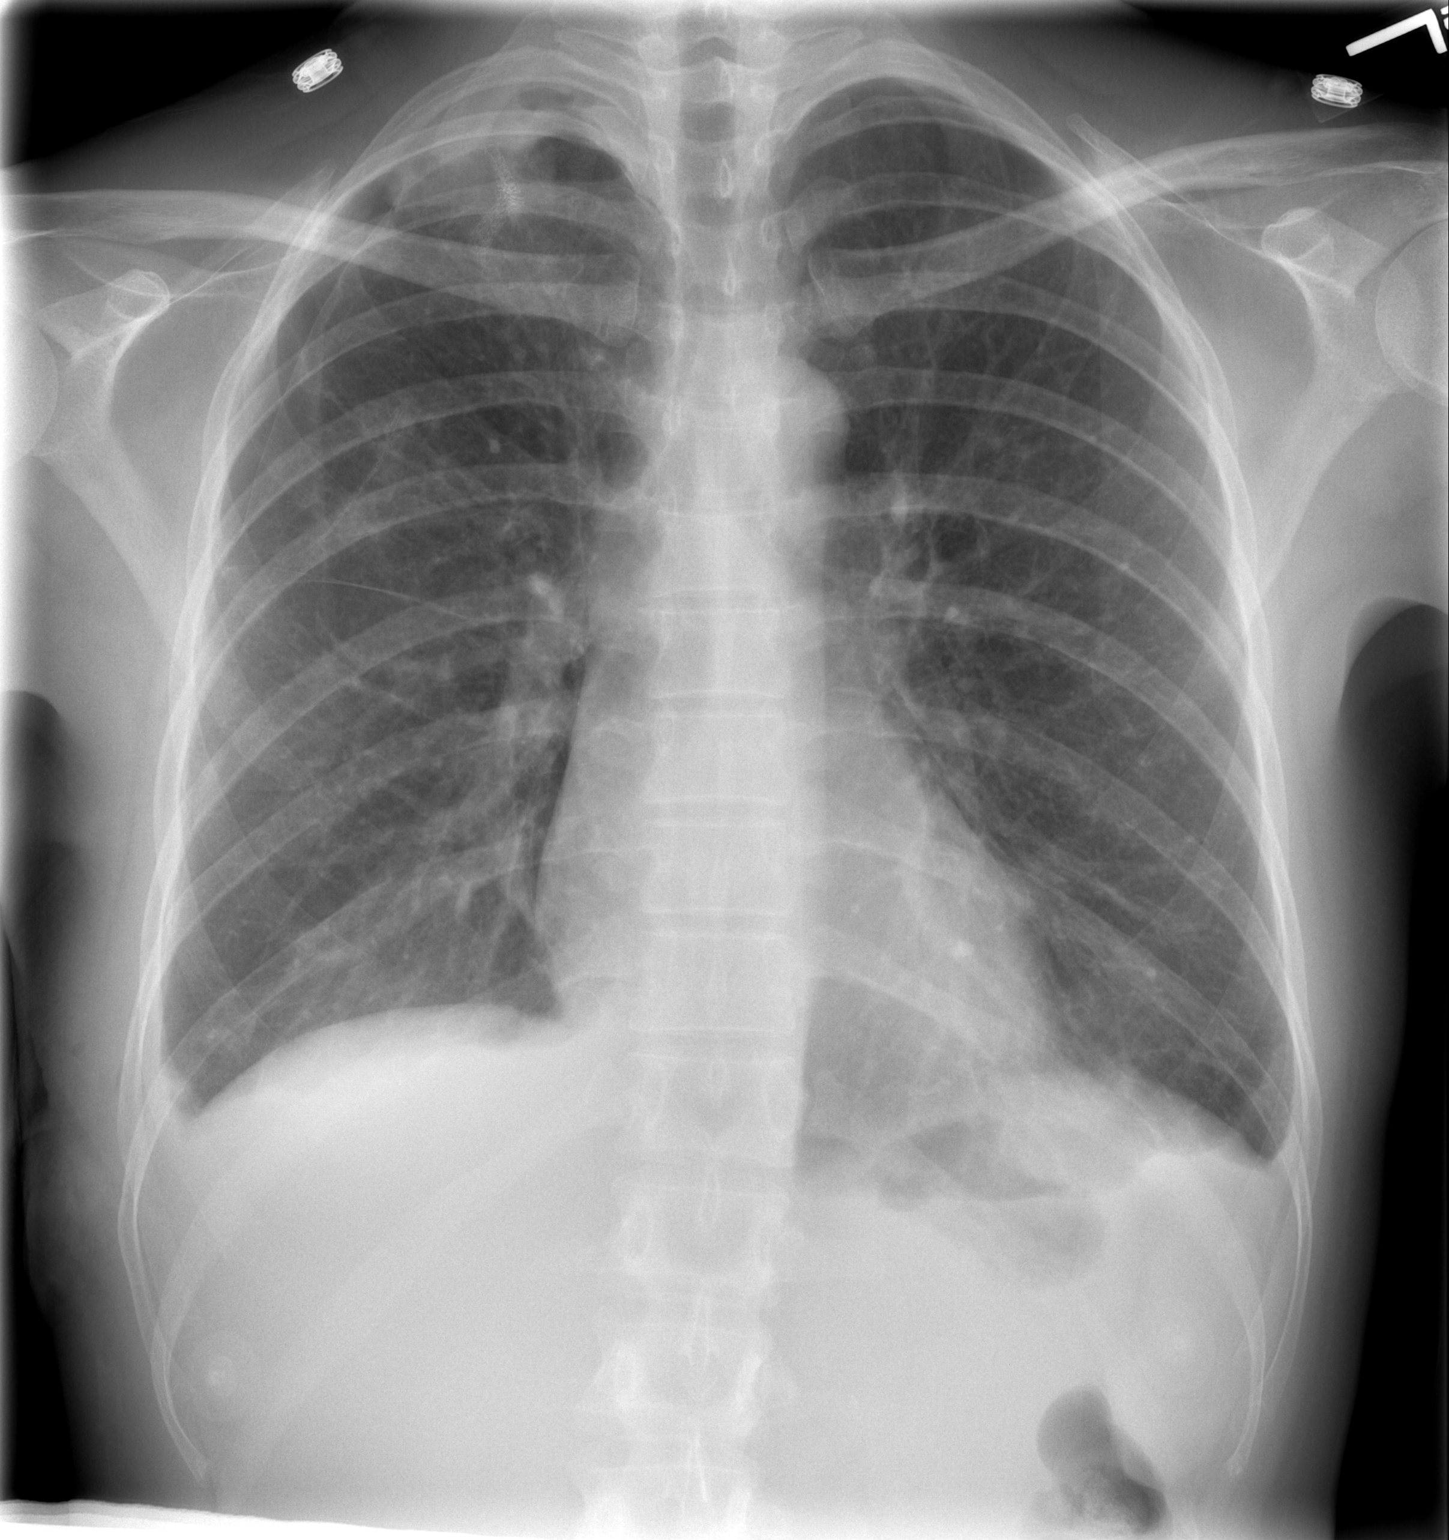

[w chest lat]
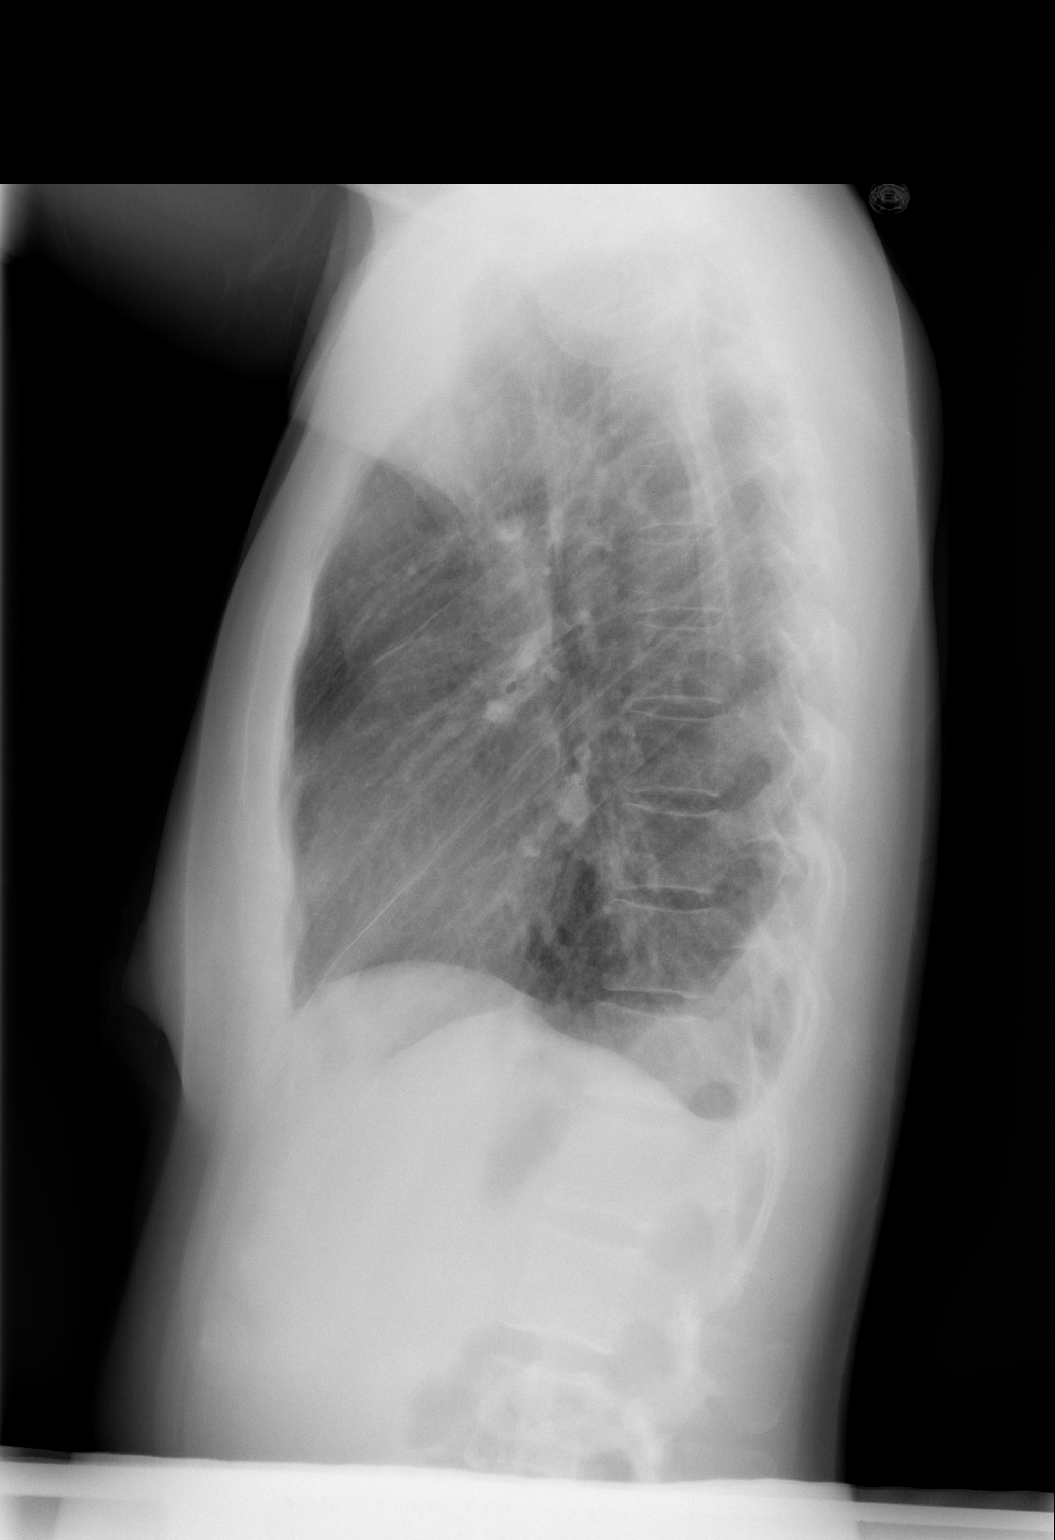

[2 of 2 positions shown; findings below may reference images not displayed]

FINDINGS: Interval removal of the right chest tube.  Enlarging
right pneumothorax, now likely approximately 10%.  Postoperative
changes on the right.  Small right pleural effusion.  No confluent
airspace opacities.  Heart is normal size.
IMPRESSION: Slight interval increase in the size of the right pneumothorax
following chest tube removal.  Now approximately 10%.

## 2012-06-09 ENCOUNTER — Ambulatory Visit
Admission: RE | Admit: 2012-06-09 | Discharge: 2012-06-09 | Disposition: A | Discharge status: Home / Self Care | Payer: BC Managed Care – PPO | Source: Ambulatory Visit | Attending: Obstetrics and Gynecology | Admitting: Obstetrics and Gynecology

## 2012-06-09 ENCOUNTER — Other Ambulatory Visit: Payer: Self-pay | Admitting: Obstetrics and Gynecology

## 2012-06-09 DIAGNOSIS — R079 Chest pain, unspecified: Secondary | ICD-10-CM

## 2012-06-13 ENCOUNTER — Ambulatory Visit
Admission: RE | Admit: 2012-06-13 | Discharge: 2012-06-13 | Disposition: A | Discharge status: Home / Self Care | Payer: BC Managed Care – PPO | Source: Ambulatory Visit | Attending: Cardiothoracic Surgery | Admitting: Cardiothoracic Surgery

## 2012-06-13 ENCOUNTER — Ambulatory Visit (INDEPENDENT_AMBULATORY_CARE_PROVIDER_SITE_OTHER): Payer: BC Managed Care – PPO | Admitting: Physician Assistant

## 2012-06-13 ENCOUNTER — Other Ambulatory Visit: Payer: Self-pay | Admitting: *Deleted

## 2012-06-13 VITALS — BP 128/77 | HR 100 | Resp 20 | Ht 62.0 in | Wt 126.0 lb

## 2012-06-13 DIAGNOSIS — J930 Spontaneous tension pneumothorax: Secondary | ICD-10-CM

## 2012-06-13 DIAGNOSIS — J939 Pneumothorax, unspecified: Secondary | ICD-10-CM

## 2012-06-13 DIAGNOSIS — J9383 Other pneumothorax: Secondary | ICD-10-CM
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
## 2012-11-24 ENCOUNTER — Other Ambulatory Visit: Payer: Self-pay | Admitting: Internal Medicine

## 2012-11-24 ENCOUNTER — Ambulatory Visit
Admission: RE | Admit: 2012-11-24 | Discharge: 2012-11-24 | Disposition: A | Discharge status: Home / Self Care | Payer: BC Managed Care – PPO | Source: Ambulatory Visit | Attending: Internal Medicine | Admitting: Internal Medicine

## 2012-11-24 DIAGNOSIS — R079 Chest pain, unspecified: Secondary | ICD-10-CM

## 2013-06-26 ENCOUNTER — Other Ambulatory Visit: Payer: Self-pay | Admitting: Obstetrics and Gynecology

## 2013-06-26 DIAGNOSIS — N6459 Other signs and symptoms in breast: Secondary | ICD-10-CM

## 2013-06-26 DIAGNOSIS — N644 Mastodynia: Secondary | ICD-10-CM

## 2013-06-30 ENCOUNTER — Ambulatory Visit
Admission: RE | Admit: 2013-06-30 | Discharge: 2013-06-30 | Disposition: A | Discharge status: Home / Self Care | Payer: BC Managed Care – PPO | Source: Ambulatory Visit | Attending: Obstetrics and Gynecology | Admitting: Obstetrics and Gynecology

## 2013-06-30 DIAGNOSIS — N6459 Other signs and symptoms in breast: Secondary | ICD-10-CM

## 2013-06-30 DIAGNOSIS — N644 Mastodynia: Secondary | ICD-10-CM

## 2016-03-26 ENCOUNTER — Ambulatory Visit
Admission: RE | Admit: 2016-03-26 | Discharge: 2016-03-26 | Disposition: A | Discharge status: Home / Self Care | Payer: BLUE CROSS/BLUE SHIELD | Source: Ambulatory Visit | Attending: Family Medicine | Admitting: Family Medicine

## 2016-03-26 ENCOUNTER — Other Ambulatory Visit: Payer: Self-pay | Admitting: Student

## 2016-03-26 ENCOUNTER — Other Ambulatory Visit: Payer: Self-pay | Admitting: Family Medicine

## 2016-03-26 DIAGNOSIS — R079 Chest pain, unspecified: Secondary | ICD-10-CM

## 2019-04-08 DIAGNOSIS — Z0100 Encounter for examination of eyes and vision without abnormal findings: Secondary | ICD-10-CM | POA: Diagnosis not present

## 2019-08-11 DIAGNOSIS — Z131 Encounter for screening for diabetes mellitus: Secondary | ICD-10-CM | POA: Diagnosis not present

## 2019-08-11 DIAGNOSIS — Z13220 Encounter for screening for lipoid disorders: Secondary | ICD-10-CM | POA: Diagnosis not present

## 2019-08-11 DIAGNOSIS — Z130 Encounter for screening for diseases of the blood and blood-forming organs and certain disorders involving the immune mechanism: Secondary | ICD-10-CM | POA: Diagnosis not present

## 2019-08-11 DIAGNOSIS — Z0000 Encounter for general adult medical examination without abnormal findings: Secondary | ICD-10-CM | POA: Diagnosis not present

## 2019-08-11 DIAGNOSIS — Z1329 Encounter for screening for other suspected endocrine disorder: Secondary | ICD-10-CM | POA: Diagnosis not present

## 2019-08-15 DIAGNOSIS — Z1151 Encounter for screening for human papillomavirus (HPV): Secondary | ICD-10-CM | POA: Diagnosis not present

## 2019-08-15 DIAGNOSIS — Z6827 Body mass index (BMI) 27.0-27.9, adult: Secondary | ICD-10-CM | POA: Diagnosis not present

## 2019-08-15 DIAGNOSIS — Z01419 Encounter for gynecological examination (general) (routine) without abnormal findings: Secondary | ICD-10-CM | POA: Diagnosis not present

## 2019-10-30 DIAGNOSIS — D225 Melanocytic nevi of trunk: Secondary | ICD-10-CM | POA: Diagnosis not present

## 2019-10-30 DIAGNOSIS — L578 Other skin changes due to chronic exposure to nonionizing radiation: Secondary | ICD-10-CM | POA: Diagnosis not present

## 2019-10-30 DIAGNOSIS — L814 Other melanin hyperpigmentation: Secondary | ICD-10-CM | POA: Diagnosis not present

## 2019-10-30 DIAGNOSIS — D2239 Melanocytic nevi of other parts of face: Secondary | ICD-10-CM | POA: Diagnosis not present

## 2019-11-30 DIAGNOSIS — E669 Obesity, unspecified: Secondary | ICD-10-CM | POA: Diagnosis not present

## 2019-11-30 DIAGNOSIS — Z6827 Body mass index (BMI) 27.0-27.9, adult: Secondary | ICD-10-CM | POA: Diagnosis not present

## 2023-07-06 ENCOUNTER — Other Ambulatory Visit: Payer: Self-pay | Admitting: Certified Nurse Midwife

## 2023-07-06 ENCOUNTER — Ambulatory Visit
Admission: RE | Admit: 2023-07-06 | Discharge: 2023-07-06 | Disposition: A | Discharge status: Home / Self Care | Source: Ambulatory Visit | Attending: Certified Nurse Midwife

## 2023-07-06 DIAGNOSIS — R0602 Shortness of breath: Secondary | ICD-10-CM

## 2024-02-09 DIAGNOSIS — Z1231 Encounter for screening mammogram for malignant neoplasm of breast: Secondary | ICD-10-CM | POA: Diagnosis not present

## 2024-02-17 ENCOUNTER — Other Ambulatory Visit (HOSPITAL_COMMUNITY): Payer: Self-pay

## 2024-02-17 MED ORDER — ESTRADIOL 0.025 MG/24HR TD PTTW
1.0000 | MEDICATED_PATCH | TRANSDERMAL | 2 refills | Status: AC
  Filled 2024-02-17: qty 8, 28d supply, fill #0
  Filled 2024-03-11: qty 8, 28d supply, fill #1

## 2024-03-15 ENCOUNTER — Ambulatory Visit
Admission: EM | Admit: 2024-03-15 | Discharge: 2024-03-15 | Disposition: A | Discharge status: Home / Self Care | Source: Home / Self Care

## 2024-03-15 ENCOUNTER — Ambulatory Visit: Payer: Self-pay | Admitting: Radiology

## 2024-03-15 ENCOUNTER — Ambulatory Visit (INDEPENDENT_AMBULATORY_CARE_PROVIDER_SITE_OTHER): Payer: Self-pay | Admitting: Radiology

## 2024-03-15 ENCOUNTER — Other Ambulatory Visit: Payer: Self-pay

## 2024-03-15 DIAGNOSIS — W108XXA Fall (on) (from) other stairs and steps, initial encounter: Secondary | ICD-10-CM

## 2024-03-15 DIAGNOSIS — Z026 Encounter for examination for insurance purposes: Secondary | ICD-10-CM | POA: Diagnosis not present

## 2024-03-15 DIAGNOSIS — M25532 Pain in left wrist: Secondary | ICD-10-CM

## 2024-03-15 DIAGNOSIS — T07XXXA Unspecified multiple injuries, initial encounter: Secondary | ICD-10-CM

## 2024-03-15 DIAGNOSIS — R0789 Other chest pain: Secondary | ICD-10-CM

## 2024-03-15 NOTE — Discharge Instructions (Addendum)
 You were seen today for concerns of left wrist and hand pain, left-sided rib pain after a fall. At this time I do not see any obvious signs of osseous injury on your x-rays but we are still waiting on radiology to provide a final interpretation.  We will keep you updated on those results once they are available as well as any changes to your management plan that are indicated. 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  Those results should return within the next week and you will be kept updated on those. For now I recommend alternating Tylenol and ibuprofen as needed for pain control. If you start having more severe symptoms such as difficulty breathing, severe pain, pain or swelling of your hand or wrist that is very severe please seek emergency care or return to urgent care.

## 2024-03-15 NOTE — ED Provider Notes (Signed)
 [facility]    CSN: [id number] Arrival date & time: 03/15/24  1454      History   Chief Complaint Chief Complaint  Patient presents with   Fall    HPI ESRAA SERES is a 43 y.o. female.   HPI  Pt is here today with concerns for injuries after a fall earlier today. She states she fell while going up some steps on the way to work and is having some pain in her left wrist and along the left side of her ribs She reports she also injured her knees with abrasions but those are bandaged up. She states she needs to complete workers comp for this injury and has submitted a safety zone portal about the incident  She reports her knees are sore from the scrapes but denies pain with flexion.  She denies pain or difficulty breathing.  She denies hitting her head or LOC during or after fall   Pain level: 5/10  She took about 800 mg of Ibuprofen this AM. She declines tylenol or ibuprofen here in clinic   Past Medical History:  Diagnosis Date   Pneumothorax 11/03/10   FIRST SPONT. PTX ON 03/21/10, BUT SEVERAL YEAR H/O EPISODIC  R CP USUALLY ASS.  WITH   MENSTRUAL PERIOD.    Patient Active Problem List   Diagnosis Date Noted   Pneumothorax 11/03/2010   Other pneumothorax 11/03/2010    Past Surgical History:  Procedure Laterality Date   apical blebs  11/03/10   apical blebs stapled, wedge resection of lower edge of rul, bx of diaphragm, plication of diaphragm    OB History   No obstetric history on file.      Home Medications    Prior to Admission medications  Medication Sig Start Date End Date Taking? Authorizing Provider  estradiol  (LYLLANA ) 0.025 MG/24HR Place 1 patch onto the skin as directed (apply 1 patch for 3 days per week, alternating with 1 patch for 4 days) 01/12/24     loratadine (CLARITIN) 10 MG tablet Take 10 mg by mouth daily.      [provider]  norethindrone-ethinyl estradiol  (JUNEL FE,GILDESS FE,LOESTRIN FE) 1-20 MG-MCG tablet Take 1  tablet by mouth 2 (two) times daily.     [provider]  sertraline (ZOLOFT) 50 MG tablet Take 50 mg by mouth daily. as directed    [provider]    Family History History reviewed. No pertinent family history.  Social History Social History[1]   Allergies   Patient has no known allergies.   Review of Systems Review of Systems  Respiratory:  Negative for chest tightness, shortness of breath and wheezing.   Musculoskeletal:        Left hand and wrist pain      Physical Exam Triage Vital Signs ED Triage Vitals  Encounter Vitals Group     BP 03/15/24 1656 121/76     Girls Systolic BP Percentile --      Girls Diastolic BP Percentile --      Boys Systolic BP Percentile --      Boys Diastolic BP Percentile --      Pulse Rate 03/15/24 1656 87     Resp 03/15/24 1656 15     Temp 03/15/24 1656 97.9 F (36.6 C)     Temp Source 03/15/24 1656 Oral     SpO2 03/15/24 1656 95 %     Weight 03/15/24 1656 162 lb (73.5 kg)     Height  03/15/24 1715 5' 2 (1.575 m)     Head Circumference --      Peak Flow --      Pain Score 03/15/24 1705 5     Pain Loc --      Pain Education --      Exclude from Growth Chart --    No data found.  Updated Vital Signs BP 121/76 (BP Location: Right Arm)   Pulse 87   Temp 97.9 F (36.6 C) (Oral)   Resp 15   Ht 5' 2 (1.575 m)   Wt 162 lb 0.6 oz (73.5 kg)   LMP 03/26/2010   SpO2 95%   BMI 29.64 kg/m   Visual Acuity Right Eye Distance:   Left Eye Distance:   Bilateral Distance:    Right Eye Near:   Left Eye Near:    Bilateral Near:     Physical Exam Vitals reviewed.  Constitutional:      General: She is awake. She is not in acute distress.    Appearance: Normal appearance. She is well-developed and well-groomed. She is not ill-appearing, toxic-appearing or diaphoretic.  HENT:     Head: Normocephalic and atraumatic.  Pulmonary:     Effort: Pulmonary effort is normal.  Chest:    Musculoskeletal:     Left  wrist: Swelling present. Decreased range of motion. Normal pulse.     Left hand: No swelling. Decreased range of motion. Normal capillary refill. Normal pulse.       Hands:     Cervical back: Normal range of motion.     Comments: Radial flexion and dorsiflexion are painful Finger flexion is limited. Unable to flex thumb to make a fist. Mild diffuse swelling along the left wrist   She does not have any evidence of abrasion or obvious deformity of the wrist, hand or fingers.    Skin:    General: Skin is warm and dry.  Neurological:     General: No focal deficit present.     Mental Status: She is alert and oriented to person, place, and time.  Psychiatric:        Mood and Affect: Mood normal.        Behavior: Behavior normal. Behavior is cooperative.        Thought Content: Thought content normal.        Judgment: Judgment normal.      UC Treatments / Results  Labs (all labs ordered are listed, but only abnormal results are displayed) Labs Reviewed - No data to display   EKG   Radiology DG Hand Complete Left Result Date: 03/15/2024 EXAM: 3 OR MORE VIEW(S) XRAY OF THE LEFT HAND 03/15/2024 06:10:21 PM COMPARISON: None available. CLINICAL HISTORY: fall FINDINGS: BONES AND JOINTS: No acute fracture. No malalignment. SOFT TISSUES: The soft tissues are unremarkable. IMPRESSION: 1. No acute fracture or dislocation. Electronically signed by: Dorethia Molt MD 03/15/2024 07:28 PM EST RP Workstation: [id number]   DG Wrist Complete Left Result Date: 03/15/2024 EXAM: 3 OR MORE VIEW(S) XRAY OF THE LEFT WRIST 03/15/2024 06:10:01 PM COMPARISON: None available. CLINICAL HISTORY: fall FINDINGS: BONES AND JOINTS: No acute fracture. No malalignment. SOFT TISSUES: The soft tissues are unremarkable. IMPRESSION: 1. No evidence of acute traumatic injury. Electronically signed by: Dorethia Molt MD 03/15/2024 07:27 PM EST RP Workstation: [id number]   DG Ribs Unilateral W/Chest Left Result Date:  03/15/2024 EXAM: 1 AP VIEW(S) XRAY OF THE LEFT RIBS AND CHEST 03/15/2024 06:10:11 PM COMPARISON: None available. CLINICAL HISTORY: fall FINDINGS:  BONES: No acute displaced rib fracture. LUNGS AND PLEURA: No consolidation or pulmonary edema. No pleural effusion or pneumothorax. HEART AND MEDIASTINUM: No acute abnormality of the cardiac and mediastinal silhouettes. SOFT TISSUES: Surgical suture overlying right apex. IMPRESSION: 1. No acute left rib fracture. 2. Surgical suture overlies the right lung apex. Electronically signed by: Dorethia Molt MD 03/15/2024 07:27 PM EST RP Workstation: [id number]    Procedures Procedures (including critical care time)  Medications Ordered in UC Medications - No data to display  Initial Impression / Assessment and Plan / UC Course  I have reviewed the triage vital signs and the nursing notes.  Pertinent labs & imaging results that were available during my care of the patient were reviewed by me and considered in my medical decision making (see chart for details).      Final Clinical Impressions(s) / UC Diagnoses   Final diagnoses:  Fall (on) (from) other stairs and steps, initial encounter  Left wrist pain  Rib pain on left side  Multiple abrasions  Encounter related to worker's compensation claim   Patient is here today for concerns of left wrist and hand pain as well as left-sided rib pain following a fall at work.  She states that she fell down several stairs and hit a concrete landing earlier this morning.  She denies hitting her head, loss of consciousness, headache, confusion.  She does report pain along the proximal aspect of the palm of her left hand and has difficulty flexing her fingers to make a fist.  She is able to touch each of her fingers to her thumb and she has intact capillary refill as well as intact radial pulse on the left side.  No obvious signs of bruising, deformity on the left hand, wrist.  No obvious signs of bruising or deformity  along the left rib cage.  Imaging interpreted by radiology does not reveal acute osseous abnormalities.  Results of imaging were discussed with patient prior to discharge.  Drug screen ordered per protocol.  Recommend Tylenol and ibuprofen as needed for pain control.  Will send patient home with an ABD wrist brace to assist with discomfort.  Recommend follow-up with PCP or health at work as needed for further symptom management.  ED and return precautions reviewed and provided in AVS.  Follow-up as needed.    Discharge Instructions      You were seen today for concerns of left wrist and hand pain, left-sided rib pain after a fall. At this time I do not see any obvious signs of osseous injury on your x-rays but we are still waiting on radiology to provide a final interpretation.  We will keep you updated on those results once they are available as well as any changes to your management plan that are indicated. 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  Those results should return within the next week and you will be kept updated on those. For now I recommend alternating Tylenol and ibuprofen as needed for pain control. If you start having more severe symptoms such as difficulty breathing, severe pain, pain or swelling of your hand or wrist that is very severe please seek emergency care or return to urgent care.     ED Prescriptions   None    PDMP not reviewed this encounter.      [1]  Social History Tobacco Use   Smoking status: Never   Smokeless tobacco: Never  Vaping Use   Vaping status: Never Used  Substance Use Topics   Alcohol use: Never   Drug use: Never     Mareesa Gathright, [place] BRAVO, PA-C 03/15/24 2043  "

## 2024-03-15 NOTE — ED Triage Notes (Signed)
 Pt presents to urgent care with a chief complaint of fall this morning at approximately 6:50 AM. States she accidentally fell down a flight of concrete stairs outside. Attempted to catch herself. Now is voicing pain in left wrist and left side of ribcage. Limited ROM noted in left hand. Currently rates overall pain a 5/10. Not on blood thinners. Denies head injury.

## 2024-03-15 NOTE — ED Triage Notes (Signed)
 SABRA

## 2024-04-21 ENCOUNTER — Other Ambulatory Visit (HOSPITAL_COMMUNITY): Payer: Self-pay | Admitting: Occupational Medicine

## 2024-04-21 ENCOUNTER — Ambulatory Visit (HOSPITAL_COMMUNITY): Admission: RE | Admit: 2024-04-21 | Source: Ambulatory Visit

## 2024-04-21 DIAGNOSIS — S8002XA Contusion of left knee, initial encounter: Secondary | ICD-10-CM

## 2024-04-21 DIAGNOSIS — S8012XA Contusion of left lower leg, initial encounter: Secondary | ICD-10-CM

## 2024-04-21 DIAGNOSIS — S8001XA Contusion of right knee, initial encounter: Secondary | ICD-10-CM

## 2024-04-21 DIAGNOSIS — S8011XA Contusion of right lower leg, initial encounter: Secondary | ICD-10-CM
